# Patient Record
Sex: Female | Born: 1935 | Race: White | Hispanic: No | Smoking: Never smoker
Health system: Southern US, Community
[De-identification: ages and names within clinical notes are randomized; demographics above are authoritative.]

## PROBLEM LIST (undated history)

## (undated) DIAGNOSIS — I4891 Unspecified atrial fibrillation: Secondary | ICD-10-CM

## (undated) DIAGNOSIS — K5909 Other constipation: Secondary | ICD-10-CM

## (undated) DIAGNOSIS — Z9289 Personal history of other medical treatment: Secondary | ICD-10-CM

## (undated) DIAGNOSIS — E785 Hyperlipidemia, unspecified: Secondary | ICD-10-CM

## (undated) DIAGNOSIS — I10 Essential (primary) hypertension: Secondary | ICD-10-CM

## (undated) DIAGNOSIS — Z952 Presence of prosthetic heart valve: Secondary | ICD-10-CM

## (undated) DIAGNOSIS — E079 Disorder of thyroid, unspecified: Secondary | ICD-10-CM

## (undated) HISTORY — DX: Unspecified atrial fibrillation: I48.91

## (undated) HISTORY — DX: Other constipation: K59.09

## (undated) HISTORY — DX: Personal history of other medical treatment: Z92.89

## (undated) HISTORY — DX: Presence of prosthetic heart valve: Z95.2

## (undated) HISTORY — DX: Hyperlipidemia, unspecified: E78.5

## (undated) HISTORY — DX: Disorder of thyroid, unspecified: E07.9

## (undated) HISTORY — DX: Essential (primary) hypertension: I10

---

## 1997-04-25 ENCOUNTER — Ambulatory Visit (HOSPITAL_COMMUNITY): Admission: RE | Admit: 1997-04-25 | Discharge: 1997-04-25 | Payer: Self-pay | Admitting: Family Medicine

## 1999-06-25 ENCOUNTER — Emergency Department (HOSPITAL_COMMUNITY): Admission: EM | Admit: 1999-06-25 | Discharge: 1999-06-26 | Payer: Self-pay | Admitting: Emergency Medicine

## 1999-07-05 ENCOUNTER — Encounter: Payer: Self-pay | Admitting: Family Medicine

## 1999-07-05 ENCOUNTER — Encounter: Admission: RE | Admit: 1999-07-05 | Discharge: 1999-07-05 | Payer: Self-pay | Admitting: Family Medicine

## 1999-10-07 ENCOUNTER — Other Ambulatory Visit: Admission: RE | Admit: 1999-10-07 | Discharge: 1999-10-07 | Payer: Self-pay | Admitting: Family Medicine

## 2000-01-09 ENCOUNTER — Encounter (INDEPENDENT_AMBULATORY_CARE_PROVIDER_SITE_OTHER): Payer: Self-pay

## 2000-01-09 ENCOUNTER — Ambulatory Visit (HOSPITAL_COMMUNITY): Admission: RE | Admit: 2000-01-09 | Discharge: 2000-01-09 | Payer: Self-pay | Admitting: Urology

## 2000-07-14 ENCOUNTER — Encounter: Payer: Self-pay | Admitting: Family Medicine

## 2000-07-14 ENCOUNTER — Encounter: Admission: RE | Admit: 2000-07-14 | Discharge: 2000-07-14 | Payer: Self-pay | Admitting: Family Medicine

## 2000-11-04 ENCOUNTER — Other Ambulatory Visit: Admission: RE | Admit: 2000-11-04 | Discharge: 2000-11-04 | Payer: Self-pay | Admitting: Family Medicine

## 2001-02-17 ENCOUNTER — Ambulatory Visit (HOSPITAL_COMMUNITY): Admission: RE | Admit: 2001-02-17 | Discharge: 2001-02-17 | Payer: Self-pay | Admitting: Gastroenterology

## 2001-10-29 ENCOUNTER — Encounter: Admission: RE | Admit: 2001-10-29 | Discharge: 2001-10-29 | Payer: Self-pay | Admitting: Neurosurgery

## 2001-10-29 ENCOUNTER — Encounter: Payer: Self-pay | Admitting: Neurosurgery

## 2001-11-08 ENCOUNTER — Other Ambulatory Visit: Admission: RE | Admit: 2001-11-08 | Discharge: 2001-11-08 | Payer: Self-pay | Admitting: Family Medicine

## 2001-11-11 ENCOUNTER — Encounter: Admission: RE | Admit: 2001-11-11 | Discharge: 2001-11-11 | Payer: Self-pay | Admitting: Neurosurgery

## 2001-11-11 ENCOUNTER — Encounter: Payer: Self-pay | Admitting: Neurosurgery

## 2001-11-25 ENCOUNTER — Encounter: Payer: Self-pay | Admitting: Endocrinology

## 2001-11-25 ENCOUNTER — Encounter: Admission: RE | Admit: 2001-11-25 | Discharge: 2001-11-25 | Payer: Self-pay | Admitting: Endocrinology

## 2003-03-18 ENCOUNTER — Emergency Department (HOSPITAL_COMMUNITY): Admission: EM | Admit: 2003-03-18 | Discharge: 2003-03-18 | Payer: Self-pay | Admitting: Emergency Medicine

## 2003-04-13 ENCOUNTER — Ambulatory Visit (HOSPITAL_COMMUNITY): Admission: RE | Admit: 2003-04-13 | Discharge: 2003-04-13 | Payer: Self-pay | Admitting: *Deleted

## 2004-09-24 ENCOUNTER — Ambulatory Visit (HOSPITAL_COMMUNITY): Admission: RE | Admit: 2004-09-24 | Discharge: 2004-09-24 | Payer: Self-pay | Admitting: *Deleted

## 2004-11-08 ENCOUNTER — Encounter (INDEPENDENT_AMBULATORY_CARE_PROVIDER_SITE_OTHER): Payer: Self-pay | Admitting: *Deleted

## 2004-11-08 ENCOUNTER — Inpatient Hospital Stay (HOSPITAL_COMMUNITY): Admission: RE | Admit: 2004-11-08 | Discharge: 2004-11-14 | Payer: Self-pay | Admitting: Cardiothoracic Surgery

## 2004-12-12 ENCOUNTER — Encounter (HOSPITAL_COMMUNITY): Admission: RE | Admit: 2004-12-12 | Discharge: 2005-03-12 | Payer: Self-pay | Admitting: *Deleted

## 2005-03-04 ENCOUNTER — Ambulatory Visit (HOSPITAL_COMMUNITY): Admission: RE | Admit: 2005-03-04 | Discharge: 2005-03-04 | Payer: Self-pay | Admitting: *Deleted

## 2005-03-13 ENCOUNTER — Encounter (HOSPITAL_COMMUNITY): Admission: RE | Admit: 2005-03-13 | Discharge: 2005-06-11 | Payer: Self-pay | Admitting: *Deleted

## 2006-01-13 DIAGNOSIS — Z9289 Personal history of other medical treatment: Secondary | ICD-10-CM

## 2006-01-13 HISTORY — DX: Personal history of other medical treatment: Z92.89

## 2006-09-27 ENCOUNTER — Emergency Department (HOSPITAL_COMMUNITY): Admission: EM | Admit: 2006-09-27 | Discharge: 2006-09-27 | Payer: Self-pay | Admitting: Emergency Medicine

## 2006-10-07 ENCOUNTER — Inpatient Hospital Stay (HOSPITAL_COMMUNITY): Admission: EM | Admit: 2006-10-07 | Discharge: 2006-10-09 | Payer: Self-pay | Admitting: Emergency Medicine

## 2006-12-24 ENCOUNTER — Inpatient Hospital Stay (HOSPITAL_COMMUNITY): Admission: EM | Admit: 2006-12-24 | Discharge: 2006-12-26 | Payer: Self-pay | Admitting: Emergency Medicine

## 2007-04-14 ENCOUNTER — Encounter: Admission: RE | Admit: 2007-04-14 | Discharge: 2007-04-14 | Payer: Self-pay | Admitting: Family Medicine

## 2007-04-30 ENCOUNTER — Inpatient Hospital Stay (HOSPITAL_COMMUNITY): Admission: AD | Admit: 2007-04-30 | Discharge: 2007-05-06 | Payer: Self-pay | Admitting: Internal Medicine

## 2007-05-06 ENCOUNTER — Ambulatory Visit: Payer: Self-pay | Admitting: *Deleted

## 2008-03-12 ENCOUNTER — Emergency Department (HOSPITAL_COMMUNITY): Admission: EM | Admit: 2008-03-12 | Discharge: 2008-03-12 | Payer: Self-pay | Admitting: Emergency Medicine

## 2010-04-30 LAB — DIFFERENTIAL
Basophils Absolute: 0.1 10*3/uL (ref 0.0–0.1)
Basophils Relative: 1 % (ref 0–1)
Eosinophils Absolute: 0.1 10*3/uL (ref 0.0–0.7)
Eosinophils Relative: 1 % (ref 0–5)
Lymphocytes Relative: 19 % (ref 12–46)
Lymphs Abs: 1.3 10*3/uL (ref 0.7–4.0)
Monocytes Absolute: 0.6 10*3/uL (ref 0.1–1.0)
Monocytes Relative: 9 % (ref 3–12)
Neutro Abs: 4.7 10*3/uL (ref 1.7–7.7)
Neutrophils Relative %: 70 % (ref 43–77)

## 2010-04-30 LAB — COMPREHENSIVE METABOLIC PANEL
ALT: 56 U/L — ABNORMAL HIGH (ref 0–35)
AST: 27 U/L (ref 0–37)
Albumin: 3.3 g/dL — ABNORMAL LOW (ref 3.5–5.2)
Alkaline Phosphatase: 134 U/L — ABNORMAL HIGH (ref 39–117)
BUN: 17 mg/dL (ref 6–23)
CO2: 30 mEq/L (ref 19–32)
Calcium: 9.2 mg/dL (ref 8.4–10.5)
Chloride: 102 mEq/L (ref 96–112)
Creatinine, Ser: 0.74 mg/dL (ref 0.4–1.2)
GFR calc Af Amer: >60 mL/min (ref >60)
GFR calc non Af Amer: >60 mL/min (ref >60)
Glucose, Bld: 111 mg/dL — ABNORMAL HIGH (ref 70–99)
Potassium: 3.4 mEq/L — ABNORMAL LOW (ref 3.5–5.1)
Sodium: 141 mEq/L (ref 135–145)
Total Bilirubin: 1.4 mg/dL — ABNORMAL HIGH (ref 0.3–1.2)
Total Protein: 5.9 g/dL — ABNORMAL LOW (ref 6.0–8.3)

## 2010-04-30 LAB — CBC
HCT: 37.2 % (ref 36.0–46.0)
Hemoglobin: 13.1 g/dL (ref 12.0–15.0)
MCHC: 35.3 g/dL (ref 30.0–36.0)
MCV: 100.7 fL — ABNORMAL HIGH (ref 78.0–100.0)
Platelets: 216 10*3/uL (ref 150–400)
RBC: 3.69 MIL/uL — ABNORMAL LOW (ref 3.87–5.11)
RDW: 14.1 % (ref 11.5–15.5)
WBC: 6.7 10*3/uL (ref 4.0–10.5)

## 2010-04-30 LAB — URINALYSIS, ROUTINE W REFLEX MICROSCOPIC
Glucose, UA: NEGATIVE mg/dL
Hgb urine dipstick: NEGATIVE
Ketones, ur: 40 mg/dL — AB
Leukocytes, UA: NEGATIVE
Nitrite: NEGATIVE
Protein, ur: 30 mg/dL — AB
Specific Gravity, Urine: 1.025 (ref 1.005–1.030)
Urobilinogen, UA: 2 mg/dL — ABNORMAL HIGH (ref 0.0–1.0)
pH: 6 (ref 5.0–8.0)

## 2010-04-30 LAB — URINE CULTURE
Colony Count: NO GROWTH
Culture: NO GROWTH

## 2010-04-30 LAB — URINE MICROSCOPIC-ADD ON

## 2010-05-28 NOTE — Discharge Summary (Signed)
[name], [name]               ACCOUNT NO.:  [id number]   MEDICAL RECORD NO.:  [id number]          PATIENT TYPE:  INP   LOCATION:  6707                         FACILITY:  [facility]   PHYSICIAN:  Ramiro Harvest, MD    DATE OF BIRTH:  1929-03-12   DATE OF ADMISSION:  12/24/2006  DATE OF DISCHARGE:  12/26/2006                               DISCHARGE SUMMARY   PRIMARY CARE PHYSICIAN:  Robert L. Foy Guadalajara, M.D., [facility].   DIAGNOSES:  1. Altered mental status secondary to hyponatremia, resolved.  2. Hyponatremia.  3. Hypertension.  4. Hypothyroidism.  5. Hyperlipidemia.  6. Degenerative joint disease.  7. History of spinal stenosis.   DISCHARGE MEDICATIONS:  1. Toprol XL 100 mg p.o. daily.  2. Aspirin 325 mg p.o. daily.  3. Vytorin 10/80 p.o. at bedtime.  4. Synthroid 88 mcg p.o. daily.  5. Zantac 150 mg p.r.n.  6. Fiber pill 1-2 pills daily,.  7. Caltrate 600 Plus D daily.  8. Stool softener daily.   DISPOSITION/FOLLOW UP:  The patient will be discharged home today.  The  patient is scheduled to follow up appointment with PCP in one week.  The  patient will also need to get a basic metabolic profile checked at her  PCP's office on Wednesday, December 30, 2006, to monitor the patient's  sodium level and potassium level.  The patient's lisinopril was held on  day of discharge and will defer the restart of the patient's lisinopril  to PCP based on the patient's latest metabolic profile and her potassium  levels.   PROCEDURES PERFORMED:  None.   CONSULTATIONS:  None.   BRIEF ADMISSION HISTORY AND PHYSICAL:  [name] is a 77-year-  old white female with past medical history of hypothyroidism and  hypertension who was last in the hospital and discharged on Benicar  without HCTZ which had been her previous medication.  The reason for  this was that the patient had been having bouts of hyponatremia so was  switched over to Benicar alone which she had been taking  for awhile, but  then the patient had run out of Benicar and had previous medication left  over from her prime medicine of Benicar/HCTZ and the patient started to  take these medications.  It was felt to have dropped the patient's  sodium to the point that her family had noticed over the day prior to  admission that the patient had been confused.  They were quite  concerned.  The had the patient to be seen by her PCP who evaluated her  and had her sent to the emergency room with a possibility of  hyponatremia.  Labs were ordered on the patient on arrival in the ED.  She was found to have a sodium of 114.  The rest of her labs including  CBC were unremarkable as well as a blood pressure.  The patient was  started on IV fluids and after a liter of IV fluids the patient appeared  to be a little more alert.  The family stated that she had improved  significantly from when  she first came in.  The patient was also able to  talk with Dr. Rito Ehrlich at that time.  The patient had stated to Dr.  Rito Ehrlich that she was feeling a little bit better but still weak.  She  complained of a mild headache as well as some neck pain which she states  is secondary to  arthritis in her neck.  She also complains of an upset  stomach.  The patient denied any visual changes.  No distress and no  chest pain.  No palpitations, no shortness of breath, wheeze, or cough.  No hematuria or dysuria.  No constipation or diarrhea.  No focal  extremity numbness, weakness, or pain.   PHYSICAL EXAMINATION:  VITAL SIGNS:  Temperature 98.2, pulse 32, blood  pressure 148/83, which decreased to 121/68, respirations 20, saturating  94% on room air.  GENERAL:  The patient was alert and oriented x3.  No apparent distress.  HEENT:  Normocephalic, atraumatic.  Mucous membranes slightly dry.  NECK:  No carotid bruits.  CARDIOVASCULAR:  Regular rate and rhythm.  S1/S2.  LUNGS:  Clear to auscultation bilaterally.  ABDOMEN:  Soft,  nontender, nondistended.  Positive bowel sounds.  EXTREMITIES:  No clubbing, cyanosis, or edema.   LABORATORY DATA:  Sodium 114, potassium 3.5, chloride 76, bicarb 28.  No  anion gap.  BUN 8, creatinine 0.7, glucose 115.  CBC:  White count 7.9,  hemoglobin 13.1, hematocrit 38, platelet count 282.  Urinalysis noted at  trace hemoglobin, 30 protein, small leukocyte esterase, 7-10 white blood  cells, many bacteria, but nitrites negative.   HOSPITAL COURSE:  1. ALTERED MENTAL STATUS FELT TO BE SECONDARY TO HYPONATREMIA.  The      patient was given normal saline IV bolus in the ED and placed on      100 ml per hour.  The patient's blood pressure medications were      held.  The patient's symptoms improved daily.  On the day of      discharge the patient's altered mental status had resolved.  The      patient was at her baseline per family and the patient was very      insistent on going home.  On the day of discharge the patient was      in improved and stable condition.  2. HYPONATREMIA.  On admission the patient was noted to have a sodium      of 114.  It was felt this was secondary to hypovolemic      hyponatremia, secondary to the HCTZ component of the patient's      medication.  The patient was fluid resuscitated with normal saline      IV.  The patient continued to improved.  She had good p.o. intake.      The patient's symptoms resolved and on the day of discharge the      patient's sodium was now at 130.  The patient was very insistent on      going home and as the patient was asymptomatic and her sodium was      going in the right direction, it was felt the patient could be sent      home, but the patient will need a basic metabolic profile checked      on Wednesday, December 30, 2006.  PCP to follow the patient's      sodium and potassium levels.  3. HYPERTENSION.  During her hospitalization, the patient's blood  pressure was stable.  The patient was not restarted on any of  her      antihypertensive medications.  The patient will be sent home to be      restarted on her Toprol XL.  Will hold the patient's lisinopril for      now and defer that for restart per PCP.  The patient will also need      BMET done on December 30, 2006 to follow her potassium as well as      her sodium.  If the patient's potassium is normal, may restart the      patient's lisinopril.  4. HYPOTHYROIDISM.  TSH was obtained which was within normal limits.      The patient was maintained on her home dose of Synthroid.  5. HYPERLIPIDEMIA:  The patient was maintained during her      hospitalization on her home dose of Vytorin.  The rest of the      patient's chronic and medical issues were stable throughout the      hospitalization.  On the day of discharge the vital signs showed      temperature 98.7, pulse 93, blood pressure 163/83, respiratory rate      22, saturating 96% on room air.   LABORATORY DATA:  Discharge labs:  Sodium 130, potassium 5, chloride 97,  bicarb 25, BUN 8, creatinine 0.64, and a glucose of 107, calcium of 8.4  The patient will be discharged home in stable and improved condition  until follow-up with her PCP.  It has been a pleasure taking care of Ms.  [name].      Ramiro Harvest, MD  Electronically Signed     DT/MEDQ  D:  12/26/2006  T:  12/28/2006  Job:  [id number]   cc:   Molly Maduro L. Foy Guadalajara, M.D.

## 2010-05-28 NOTE — H&P (Signed)
NAME:  [name], [name]               ACCOUNT NO.:  [id number]   MEDICAL RECORD NO.:  [id number]          PATIENT TYPE:  INP   LOCATION:  5030                         FACILITY:  [facility]   PHYSICIAN:  Kela Millin, M.D.DATE OF BIRTH:  Jan 24, 1929   DATE OF ADMISSION:  04/30/2007  DATE OF DISCHARGE:                              HISTORY & PHYSICAL   CHIEF COMPLAINT:  Progressive decreased p.o. intake and weight loss.   HISTORY OF PRESENT ILLNESS:  The patient is a 77 year old white female  with a past medical history significant for hypertension,  hypothyroidism, hyperlipidemia, DJD, history of aortic valve replacement  in 2006 with a tissue valve complicated by postoperative atrial  fibrillation, history of spinal stenosis, last discharged from the  hospital in December 2008 following hospitalization with altered mental  status thought to be secondary to hyponatremia.  She presents with the  above complaints.  Per her primary care physician, the patient was seen  at the office today and because of her ongoing and worsening problems  with decreased p.o. intake to where she has not been taking her  medications lately and also has been losing weight, she was directly  admitted to the hospital for further evaluation and management.  The  patient's daughter is at her bedside assisting with the history.  They  report that since her last discharge from the hospital, she has been  having intermittent problems with decreased p.o. intake and constipation  but that lately this had worsened to the point where the patient has not  even been taking her medications.  They report that she lost her husband  in March and was diagnosed with depression and was tried on different  antidepressants by her primary care physician, but her symptoms seemed  to worsen and so after taking them for a couple of months she stopped.  The patient states that when she swallows her food it feels like it gets  stuck in her  lower chest area, and because of that she does not feel  like eating.  She denies nausea, vomiting, odynophagia, and no  hematemesis.  She states that she feels full but no abdominal pain.  She  denies chest pain, shortness of breath, cough, hemoptysis, melena,  hematochezia.  No diarrhea.  She states that she has had problems with  constipation and she cannot recall when her last bowel movement was.  She admits to dysuria for some time.  Denies fevers and no chills.  The  patient had an MRI of her brain done outpatient, and it was negative per  PCP.   PAST MEDICAL HISTORY:  As above.   MEDICATIONS:  1. Lisinopril 40 mg daily.  2. Toprol XL 100 mg daily.  3. Synthroid 88 mcg daily.   There are other medications listed in her December discharge.  The  medications are:  1. Aspirin 325 mg daily.  2. Zantac 150 mg p.r.n.  3. Stool softener.  4. Vytorin 10/80 mg q.h.s.   As noted above, the patient has not been taking any of the above  medications for some time now.  ALLERGIES:  CODEINE AND CELEBREX.   SOCIAL HISTORY:  She denies tobacco.  She also denies alcohol.   FAMILY HISTORY:  Her brother had leukemia and her mother had an MI in  her 59s.  Her father had heart disease.   REVIEW OF SYSTEMS:  As per HPI, other review of systems negative.   PHYSICAL EXAMINATION:  GENERAL:  The patient is an elderly white female.  Flat affect.  Delayed but appropriate responses to questions.  In no  apparent distress.  VITAL SIGNS:  Her temperature is 97 with a pulse of 113, respiratory  rate of 14, blood pressure 170/85, O2 sat 93% on room air.  HEENT:  PERRL, EOMI.  Dry mucous membranes.  No oral exudates.  NECK:  Supple.  No adenopathy, no thyromegaly and no JVD.  LUNGS:  Decreased breath sounds at the bases.  No crackles and no  wheezes.  CARDIOVASCULAR:  Regular.  Normal normal S1 and S2.  ABDOMEN:  Soft.  Bowel sounds present.  Nontender, nondistended.  No  organomegaly and no  masses palpable.  EXTREMITIES:  No cyanosis and no edema.  NEURO:  She is alert and oriented.  Cranial nerves II-XII grossly intact  and a nonfocal exam.   LABORATORY DATA:  All lab work ordered and pending at this time.   ASSESSMENT AND PLAN:  1. Probable dysphagia - as discussed above, she states that food      sits in her lower chest area after she eats, and she has had      worsening p.o. intake and some weight loss.  We will obtain a      complete barium swallow, follow and consider a GI consultation      pending above.  2. Uncontrolled hypertension - as mentioned above, the patient has      been noncompliant with medications lately.  We will resume      outpatient medications, monitor and treat as appropriate.  3. Depression versus grief reaction/failure to thrive - as discussed      above, she has been tried on antidepressants outpatient and she      stopped after a couple of months because her symptoms appeared to      be worsening.  Follow and consider psychiatric consultation while      in the hospital.  4. Constipation - chronic.  Continue stool softeners, Dulcolax p.r.n.      Obtain a CBC and if anemic stool guaiacs, follow and consider      further GI evaluation as appropriate.  We will also check a TSH      level, especially given that she has been noncompliant with her      medications and follow.  5. Hyperlipidemia - follow and resume outpatient medications as      appropriate.  6. History of hyponatremia - obtain electrolytes, follow and manage as      appropriate.  7. Tachycardia - likely secondary to volume depletion, obtain an EKG,      hydrate, follow and recheck.  8. Dysuria - obtain a UA with cultures and follow.  9. History of aortic valve replacement with a tissue valve in 2006.      Kela Millin, M.D.  Electronically Signed     ACV/MEDQ  D:  04/30/2007  T:  04/30/2007  Job:  [id number]   cc:   Molly Maduro L. Foy Guadalajara, M.D.

## 2010-05-28 NOTE — H&P (Signed)
NAMEBEULA, [name]               ACCOUNT NO.:  [id number]   MEDICAL RECORD NO.:  [id number]          PATIENT TYPE:  INP   LOCATION:  1829                         FACILITY:  [facility]   PHYSICIAN:  Hollice Espy, M.D.DATE OF BIRTH:  Mar 21, 1929   DATE OF ADMISSION:  12/24/2006  DATE OF DISCHARGE:                              HISTORY & PHYSICAL   PRIMARY CARE PHYSICIAN:  Robert L. Foy Guadalajara, M.D.   CHIEF COMPLAINT:  Confusion.   HISTORY OF PRESENT ILLNESS:  The patient is a 77 year old white female  with past medical history of hypothyroidism and hypertension who was  last in the hospital and discharged on Benicar without  hydrochlorothiazide which had been her previous medication.  The reason  for this is that she had been having drops in her sodium.  She was  switched over to Benicar alone which she was taking for a while, but  then when she ran out she had her previous medication left over and  started taking the Benicar again with the hydrochlorothiazide component.  This is felt to have dropped her sodium to the point that her family has  noticed over the last day that she has been confused.  They were quite  concerned.  They had the patient see her PCP who evaluated her and had  her sent over to the emergency room concerned about the possibility of  low sodium.  Labs were ordered on the patient when she arrived to the  emergency room.  She was found to have a sodium of 114.  The rest of her  labs including CBC were unremarkable as well as her blood pressure.  The  patient was started on IV fluids and after 1 liter of fluids she already  appears to be  a bit more alert and the family says that she is already  much improved than when she first came in.  The patient herself is  currently able to talk to me.  She tells me that she is feeling a little  bit better, still very weak.  She complained of a mild headache as well  as some neck pain which she says is from her arthritis in her  neck.  She  also complains of some upset stomach.  She denies any vision changes, no  dysphagia.  No chest pain or palpitations.  No shortness of breath,  wheeze, cough.  No hematuria or dysuria.  No constipation or diarrhea.  No focal extremity numbness, weakness, or pain.   REVIEW OF SYSTEMS:  Otherwise negative.   PAST MEDICAL HISTORY:  Hypertension and hypothyroidism, hyperlipidemia,  and DJD.   MEDICATIONS:  1. She is supposed to be on Benicar 40 mg p.o. daily, but what she has      been taking instead is Benicar/hydrochlorothiazide 40/25 mg p.o.      daily.  2. Toprol XL 100 mg.  3. Aspirin 325 mg.  4. Pepcid 20 mg p.o. q.h.s.  5. Vytorin 10/10 p.o. daily.  6. Percocet p.r.n.  7. Synthroid.   ALLERGIES:  CELEBREX, CODEINE.   SOCIAL HISTORY:  No tobacco, alcohol,  or drug use.   FAMILY HISTORY:  Noncontributory.   PHYSICAL EXAMINATION:  VITAL SIGNS:  Temperature 98.2, heart rate 82,  blood pressure 148/83 now down to 121/68, respirations 22, O2 saturation  94% on room air.  GENERAL:  She is alert and oriented x3 in no apparent distress.  HEENT:  Normocephalic and atraumatic.  Her mucous membranes are slightly  dry.  She has no carotid bruits.  HEART:  Regular rate and rhythm, S1 and S2.  LUNGS:  Clear to auscultation bilaterally.  ABDOMEN:  Soft, nontender, and nondistended.  Positive bowel sounds.  EXTREMITIES:  No cyanosis, clubbing, or edema.   LABORATORY DATA:  Sodium 114, potassium 3.5, chloride 76, bicarb 28.  She has no anion gap.  BUN 8, creatinine 0.7, glucose 115.  White count  7.9, H&H 13.1 and 38, MCV 100, platelet count 282, 50% neutrophils.  UA  notes a trace hemoglobin, 30 protein, small leukocyte esterase, 7-10  white cells, and many bacteria, but nitrite negative.   ASSESSMENT:  1. Altered mental status.  This is secondary to hyponatremia.  2. Hyponatremia secondary to resumption of thiazide diuretic.  Stop      her thiazide diuretic and hydrate.  Recheck labs in the morning.  3. Hypertension.  We are holding her Benicar until we have properly      hydrated her.  4. Hypothyroidism.      Hollice Espy, M.D.  Electronically Signed     SKK/MEDQ  D:  12/24/2006  T:  12/24/2006  Job:  [id number]   cc:   Molly Maduro L. Foy Gu[place]ara, M.D.

## 2010-05-28 NOTE — Discharge Summary (Signed)
NAME:  [name], [name]               ACCOUNT NO.:  [id number]   MEDICAL RECORD NO.:  [id number]          PATIENT TYPE:  INP   LOCATION:  5030                         FACILITY:  [facility]   PHYSICIAN:  Corinna L. Lendell Caprice, MDDATE OF BIRTH:  [birth year]   DATE OF ADMISSION:  04/30/2007  DATE OF DISCHARGE:  05/05/2007                               DISCHARGE SUMMARY   DISCHARGE DIAGNOSES:  1. Failure to thrive/weight loss secondary to severe depression with      an episode of psychosis while here in the hospital.  2. Uncontrolled hypertension secondary to noncompliance.  3. Hypothyroidism.  4. Resolved hypokalemia.   MEDICATIONS AT THE TIME OF TRANSFER:  1. Aspirin 325 mg a day.  2. Toprol XL 100 mg a day.  3. Lisinopril 40 mg a day.  4. Levothyroxine 88 mcg a day.  5. Haldol 2 mg IV or p.o. as needed.  6. She had been started on clonidine but I do not think that she will      ultimately end up needing this.  7. She has been getting Ensure t.i.d. and we have been trying to      encourage her oral intake.   DIET:  Regular with Ensure.  Please encourage oral intake.   She will need a repeat basic metabolic panel with attention to potassium  in a few days.  Psychiatric medications per psychiatrist.   CONDITION:  Stable.   CONSULTATIONS:  Jasmine Pang, M.D.   PROCEDURES:  None.   PERTINENT LABORATORIES:  CBC unremarkable.  Complete metabolic panel on  admission significant for an albumin of 3.4.  Her potassium on admission  was 3.6 but dropped to 2.7 and was subsequently repleted.  Point of care  enzymes essentially negative.  Ammonia level normal.  TSH 2.98.  B12,  RBC folate normal.  Urinalysis showed a specific gravity of 1.033, 40  ketones, moderate bilirubin, trace blood, 30 protein, negative nitrite,  small leukocyte esterase, 11-20 white cells, few squamous epithelial  cells, few bacteria, but urine culture was indicative of a contaminant.  RPR nonreactive.   SPECIAL  STUDIES/RADIOLOGY:  Two views of the chest showed nothing  active.  An MRI of the brain had been done several weeks prior to  admission.  Barium swallow showed no stricture or discrete mass, but the  exam was limited due to difficulty with patient compliance.  Esophageal  motility was within normal limits.  Sliding hiatal hernia.  Incidental  duodenal diverticulum.  CT of the abdomen and pelvis showed  cholecystectomy clips, minimal dilatation of the intrahepatic and  extrahepatic bile duct, possibly within normal limits in a patient who  is status post cholecystectomy.  Recommend clinical correlation.  EKG  showed normal sinus rhythm with left ventricular hypertrophy and  repolarization abnormality.   HISTORY AND HOSPITAL COURSE:  Ms. Mcquilkin is a 77 year old white female  who was brought to the emergency room with failure to thrive and weight  loss.  She had been on antihypertensives and thyroid replacement but  stopped this on her own.  She had mentioned some sketchy dysphagia and  the workup was negative.  She recently lost her husband to cancer and  the daughter reported profound dehydration.  She was admitted from Dr.  Pablo Lawrence office and had lost about 30 pounds in the past several months.  She had dry mucous membranes.  Her heart rate was 113, blood pressure  170/85.  She had a soft abdomen.  She was oriented.  She had very flat  affect and a workup was essentially negative for medical reason for  weight loss.  It became clear that the patient had vegetative symptoms  of depression.  She had a paucity of speech.  She admitted that her  sleep patterns were erratic.  She no longer enjoyed things that she used  to enjoy.  She did not feel hungry, she never ate, and her daughter  confirmed this.  Psychiatry was consulted and initially the patient's  exam appeared normal; however, she appeared somewhat expansive at that  time and there was concern that she may be cycling and possibly  have  early mania.  Right after she was seen by Dr. Katrinka Blazing, the patient became  psychotic and was hallucinating.  She thought that the nurses were  trying to give her alcohol and she thought a devil was on her shoulder.  She was refusing all of her medications.  She was given Haldol, which  helped with her agitation, but she very quickly became quite depressed  again.  Nutrition was consulted and made recommendations.  Her intake  remains subnormal but has improved since she has been here.   Her blood pressure remained a problem initially and her medications were  adjusted.  At the time of discharge she is normotensive and she is back  on all of her medications.  Dr. Katrinka Blazing feels that the patient will need  inpatient psychiatric treatment and she is awaiting placement at an  inpatient facility at this time.  She is medically stable.  Dr. Katrinka Blazing  reports that the patient is committable.   Activity should be ad lib.   CONDITION:  Stable.   She will need follow-up with her primary care physician in 2-4 weeks  after discharge from psych facility.      Corinna L. Lendell Caprice, MD  Electronically Signed     CLS/MEDQ  D:  05/05/2007  T:  05/05/2007  Job:  [id number]   cc:   Molly Maduro L. Foy Guadalajara, M.D.

## 2010-05-28 NOTE — Discharge Summary (Signed)
[name], [name]               ACCOUNT NO.:  [id number]   MEDICAL RECORD NO.:  [id number]          PATIENT TYPE:  INP   LOCATION:  6733                         FACILITY:  [facility]   PHYSICIAN:  Barnetta Chapel, MDDATE OF BIRTH:  02/14/1929   DATE OF ADMISSION:  10/06/2006  DATE OF DISCHARGE:  10/09/2006                               DISCHARGE SUMMARY   PRIMARY CARE Jovan Colligan:  Molly Maduro L. Foy Guadalajara, M.D.   DISCHARGE DIAGNOSES:  1. Altered mental status, resolved.  2. Hyponatremia, resolved.  3. Hypokalemia, resolved.  4. Nausea and vomiting, resolved.  5. Hypertension, controlled.  6. Back pain.  7. Spinal stenosis.   DISCHARGE MEDICATIONS:  1. Levoxyl 100 mcg once daily.  2. Toprol XL 100 mg once daily.  3. Aspirin 325 mg p.o. once daily.  4. Fiber Choice 1 p.o. once.  5. Vytorin 10/80 one tab p.o. once daily.  6. Benicar 40 mg once daily.  7. Colace 100 mg p.o. b.i.d.  8. Tramadol 50 mg p.o. four times daily as needed for pain.   CONSULTATIONS:  None.   IMAGING STUDIES:  MRI of the lumbosacral area which revealed severe  central spinal stenosis of L3-4, mild L4-5 and L5-S1; neural foraminal  stenosis involving left L3-L5 and L5-S1 and right L4-5.   BRIEF HISTORY AND HOSPITAL COURSE:  Please refer to the history and  physical done on October 07, 2006.  The patient is a 77 year old  female with past medical history significant for hypertension, coronary  artery disease, hypothyroidism, gastroesophageal reflux disease and  lower extremity and back pain.  The patient presented with nausea,  vomiting, weakness and altered mental status which was felt to be  secondary to pain medications.  On admission, the patient's sodium was  110 and the patient was also hypokalemic and dehydrated.   The patient was admitted to the regular medical floor.  The patient was  adequately rehydrated.  The hyponatremia improved to 132 on the day of  discharge.  The patient's hypokalemia was  also corrected.  Nausea and  vomiting were managed with antiemetics.  The patient's back pain was  worked up with an MRI of the lumbosacral region (please see above).  The  patient's back pain is well controlled.  She will be discharged on  Tramadol 50 mg q.i.d. for the back pain.   The patient has done well and will be discharged back home today to the  care of the primary care Toretto Tingler.   DISCHARGE PLAN:  1. Discharge the patient home today.  2. Follow-up with the primary care Alayja Armas, Dr. Marinda Elk in one      week.  3. Home PT/OT.  4. Cardiac diet.  5. Activities as per PT/OT.      Barnetta Chapel, MD  Electronically Signed     SIO/MEDQ  D:  10/09/2006  T:  10/09/2006  Job:  [id number]   cc:   Molly Maduro L. Foy Guadalajara, M.D.

## 2010-05-28 NOTE — H&P (Signed)
NAMEARIANIS, BOWDITCH               ACCOUNT NO.:  [id number]   MEDICAL RECORD NO.:  [id number]          PATIENT TYPE:  INP   LOCATION:  6733                         FACILITY:  [facility]   PHYSICIAN:  Hollice Espy, M.D.DATE OF BIRTH:  April 23, 1929   DATE OF ADMISSION:  10/07/2006  DATE OF DISCHARGE:                              HISTORY & PHYSICAL   PRIMARY CARE PHYSICIAN:  Robert A. Nicholos Johns, M.D.   CHIEF COMPLAINT:  Nausea, vomiting, weakness.   HISTORY OF PRESENT ILLNESS:  The patient is a 77 year old white female  with past medical history of hypertension, CAD, and hypothyroidism who  for the last several weeks has been feeling very weak. She has had  problems with lower back pain and had been put on oral pain medications.  When her back pain persisted, she came to the emergency room and her  pain medication was increased in dosage.  This lead to her having  episodes of nausea and vomiting.  She also tells me that she has not had  a solid bowel movement in the last several weeks, ever since she has  been put on narcotic medication.  With the nausea and vomiting  persisting, her appetite got worse and she has been feeling more and  more weak.  She was brought into the emergency room by her daughters  today.  Labs were done on the patient on admission and she was found to  have a normal white count with no signs of infection.  However, what was  concerning was a sodium of 112.  The patient was given medications for  nausea and was feeling a little bit better although she feels still  quite fatigued.   She denies any headaches, vision changes, dysphagia, chest pain,  palpitations, shortness of breath, wheezing, coughing.  She denies any  abdominal pain.  No hematuria or dysuria and diarrhea.  But she does  have constipation.  No focal extremity numbness, weakness or pain.  She  feels overall quite weak generally.  Review of systems is otherwise  negative. She also has some mild  nausea.   PAST MEDICAL HISTORY:  1. Hypertension.  2. CAD.  3. Hypothyroidism.  4. GERD.  5. Lower extremity and back pain which is subacute.   MEDICATIONS:  1. Benicar/HCTZ 40/25 p.o. daily.  2. Toprol-XL 100 mg daily.  3. Aspirin 325 mg daily.  4. Pepcid 20 mg at bedtime.  5. Vytorin 10/10 p.o. at bedtime.  6. Percocet p.r.n.  7. Synthroid daily dose unclear although based on previous records,      112 mcg.   ALLERGIES:  CODEINE, CELEBREX.   SOCIAL HISTORY:  She denies any tobacco, alcohol or drug use.  She lives  at home.   FAMILY HISTORY:  Noncontributory.   PHYSICAL EXAMINATION:  VITAL SIGNS:  On admission, temperature 97, heart  rate 74, blood pressure 158/71, respirations 28, oxygen saturation 95%  on room air.  GENERAL:  She is alert and oriented x3, in no apparent distress  HEENT:  Normocephalic, atraumatic.  Mucous membranes are slightly dry.  She has no carotid bruits.  HEART:  Regular rate and rhythm.  S1 and S2.  LUNGS:  Clear to auscultation bilaterally.  ABDOMEN:  Soft, slightly distended.  Hypoactive bowel sounds.  EXTREMITIES:  No clubbing, cyanosis or edema.Marland Kitchen   LABORATORY DATA:  Sodium 112, potassium 3.3, chloride 88, bicarb 29, BUN  8, creatinine 0.6, glucose 135.  White count 7.5, no shift.  Hemoglobin  12.8, hematocrit 36, MCV 98, platelet count 283,000.   ASSESSMENT/PLAN:  1. Hyponatremia.  This is likely a combination of dehydration and      vomiting plus the patient being on a thiazide diuretic.  Will hold      this medication.  Put her on 2000 cc fluid restriction and gently      hydrate and continue to follow.  2. Nausea and vomiting.  Likely in part she may have some sensitivity      to increased narcotic medication. Another possibility she is likely      constipated, also from narcotics.  Will p.r.n. Zofran for the      patient as well as plans to put her on a bowel regimen of Colace      and a laxative.  3. Lower  back pain.  No signs of  any severe pain.  This appears to be      more degenerative joint disease and some sciatica from prolonged      bed rest.  Will get PT/OT evaluation.  4. Hypertension.  Continue Benicar.  5. Hypothyroidism.  Continue Synthroid.  6. Hypokalemia.  Will replace.      Hollice Espy, M.D.  Electronically Signed     SKK/MEDQ  D:  10/07/2006  T:  10/07/2006  Job:  [id number]   cc:   Molly Maduro A. Nicholos Johns, M.D.

## 2010-05-31 NOTE — Procedures (Signed)
[place]  Patient:    [name], [name] Visit Number: [id number] MRN: [id number]          Service Type: END Location: ENDO Attending Physician:  Nelda Marseille Dictated by:   Petra Kuba, M.D. Proc. Date: 02/17/01 Admit Date:  02/17/2001   CC:         [name], M.D.                           Procedure Report  PROCEDURE:  Esophagogastroduodenoscopy.  INDICATION:  Guaiac positivity, nondiagnostic colonoscopy.  Consent was signed after risks, benefits, methods, and options were thoroughly discussed with both Mr. and Mrs. [name] in the office.  MEDICATIONS:  Additional medications for this procedure 1 of Versed only.  PROCEDURE:  The video endoscope was inserted by direct vision.  She had some reflux in her esophagus, which was easily suctioned.  She did have a moderate sized hiatal hernia.  The scope was inserted into the stomach, advanced to the antrum, and through a normal-appearing pylorus into a normal duodenal bulb, and around the C-loop to a normal second portion of the duodenum.  The scope was slowly withdrawn back to the bulb and a good look there ruled out ulcer in that location.  The stomach was evaluated on retroflexed and then straight visualization.  There was some mild gastritis antritis and some scope trauma on the pylorus with passing the scope.  However, due to her moderate sized hiatal hernia had some difficulty holding the air and complete maximal insufflation was not obtained, but no abnormality was seen.   High in the cardia the hiatal hernia was confirmed.  The scope was then slowly withdrawn. Again, a good look at the esophagus was normal.  The scope was removed.  The patient tolerated the procedure well.  There was no obvious immediate complication.  ENDOSCOPIC DIAGNOSES: 1. Moderate hiatal hernia with some reflux. 2. Gastritis antritis. 3. Unable to hold air and completely insufflate stomach. 4.  Otherwise normal esophagogastroduodenoscopy.  PLAN:  Follow-up p.r.n. and in two months recheck guaiac symptoms and determine any other work-up and plans. Dictated by:   Petra Kuba, M.D. Attending Physician:  Nelda Marseille DD:  02/17/01 TD:  02/18/01 Job: [id number]

## 2010-05-31 NOTE — Discharge Summary (Signed)
[name], [name]               ACCOUNT NO.:  [id number]   MEDICAL RECORD NO.:  [id number]          PATIENT TYPE:  INP   LOCATION:  2005                         FACILITY:  [facility]   PHYSICIAN:  Sheliah Plane, MD    DATE OF BIRTH:  September 28, 1929   DATE OF ADMISSION:  11/08/2004  DATE OF DISCHARGE:                                 DISCHARGE SUMMARY   PRIMARY DIAGNOSES:  Aortic valve stenosis.   IN-HOSPITAL DIAGNOSES:  1.  Postoperative atrial fibrillation.  2.  Acute blood loss anemia postoperatively.  3.  Volume overload.  4.  Hypokalemia.   SECONDARY DIAGNOSES:  1.  Hypothyroidism.  2.  History of arthritis and status post __________ chelation therapy.  3.  Status post cholecystectomy.  4.  Status post appendectomy.   ALLERGIES:  Allergic to CELEBREX and CODEINE.   IN-HOSPITAL OPERATIONS/PROCEDURES:  Dictation ended at this point.      Theda Belfast, [place]      Sheliah Plane, MD  Electronically Signed    KMD/MEDQ  D:  11/12/2004  T:  11/12/2004  Job:  [id number]

## 2010-05-31 NOTE — Procedures (Signed)
[place]  Patient:    YARETSI, HUMPHRES Visit Number: [id number] MRN: [id number]          Service Type: END Location: ENDO Attending Physician:  Nelda Marseille Dictated by:   Petra Kuba, M.D. Proc. Date: 02/17/01 Admit Date:  02/17/2001   CC:         Marinda Elk, M.D.                           Procedure Report  PROCEDURE:  Colonoscopy.  INDICATION:  Guaiac positivity.  Consent was signed after risks, benefits, methods, and options thoroughly discussed in the office.  MEDICATIONS:  Demerol 75 mg, Versed 7.5 mg.  DESCRIPTION OF PROCEDURE:  Rectal inspection was pertinent for external hemorrhoids, small.  Digital exam was negative.  The video pediatric adjustable colonoscope was inserted, fairly easily advanced around the colon to the cecum.  It did require rolling her on her back and various abdominal pressures.  On insertion, left-sided diverticula were seen and no other abnormalities.  The cecum was then identified by the appendiceal orifice and the ileocecal valve.  In fact, the scope was inserted a short way into the terminal ileum, which was normal.  Photo documentation was obtained.  The scope was slowly withdrawn.  Prep was adequate.  There was some liquid stool that required washing and suctioning, but on slow withdrawal through the colon, other than the left-sided diverticula, no other abnormalities were seen.  Specifically, no polypoid lesions, bleeding lesions, or other abnormalities.  Once back in the rectum the scope was retroflexed, pertinent for some internal hemorrhoids.  The scope was straightened, and rectal pull-through confirmed the hemorrhoids.  The scope was reinserted a short way up the left side of the colon, air was suctioned, and scope removed.  The patient tolerated the procedure well.  There was no obvious immediate complication.  ENDOSCOPIC DIAGNOSES: 1. Internal-external hemorrhoids. 2. Left-sided  sigmoid diverticula. 3. Otherwise within normal limits to the terminal ileum.  PLAN:  Repeat screening in five to 10 years if doing well medically. Otherwise, continue workup with an EGD.  Please see that dictation for further workup and plans. Dictated by:   Petra Kuba, M.D. Attending Physician:  Nelda Marseille DD:  02/17/01 TD:  02/18/01 Job: [id number]

## 2010-05-31 NOTE — Op Note (Signed)
[name], [name]               ACCOUNT NO.:  [id number]   MEDICAL RECORD NO.:  [id number]          PATIENT TYPE:  INP   LOCATION:  2005                         FACILITY:  [facility]   PHYSICIAN:  Sheliah Plane, MD    DATE OF BIRTH:  1929/12/27   DATE OF PROCEDURE:  11/12/2004  DATE OF DISCHARGE:                                 OPERATIVE REPORT   PREOPERATIVE DIAGNOSIS:  Critical aortic stenosis.   POSTOPERATIVE DIAGNOSIS:  Critical aortic stenosis.   PROCEDURE:  Aortic valve replacement with a St. Jude [place] stentless  valve, model [id number], serial number [id number].   SURGEON:  Sheliah Plane, M.D.   FIRST ASSISTANT:  Salvatore Decent. Cornelius Moras, M.D.   HISTORY:  The patient is a 77 year old female with longstanding known aortic  stenosis.  Aortic valve replacement had been recommended 18 months ago to  the patient.  However, she decided to wait and then recently presented with  increase in symptoms of congestive heart failure.  She underwent cardiac  catheterization and had luminal irregularities in the coronary vessels and a  30-40% LAD lesion but no critical stenoses and worsening aortic valve  disease.  Again, the patient agreed to aortic valve replacement.   DESCRIPTION OF PROCEDURE:  With Swan-Ganz and arterial line monitors in  place, the patient underwent general endotracheal anesthesia without  incidence.  The skin of the chest and legs was prepped with Betadine and  draped in the usual sterile manner.  A median sternotomy was performed.  The  pericardium was opened.  The patient had evidence of mild ventricular  hypertrophy.  She was systemically heparinized.  The ascending aorta and the  right atrium were cannulated.  The patient was placed on cardiopulmonary  bypass 2.4 liters per minute per m square.  The right superior pulmonary  vein vent was placed.  The patient's body temperature was cooled to 30  degrees.  Aortic cross clamp was applied and 500 mL of cold blood  potassium  cardioplegia was administered with rapid diastolic arrest of the heart.  Myocardial septal temperature was monitored throughout the cross clamp.  A  tranverse aortotomy was performed.  The aortic valve was a tricuspid aortic  valve with severe cusp aortic stenosis.  The valve was excised, and the  annulus debrided carefully taking care to remove all loose calcific debris.  The annulus was sized to a 23 mm with a sinotubular ridge slightly smaller,  approximately 21.  The coronaries were positioned such as to allow placement  of a stentless valve using a St. Jude Toronto stentless valve, model  [id number], serial [id number] was selected and 4-0 Tycron sutures were placed  in a circumferential fashion in the annulus for the proximal suture line.  With the sutures all placed, they were then placed through the lower edge of  the stent.  The stentless valve was then lowered into place and with the  valve inverted into the ventricle each of the sutures were tied and 4-0  Prolenes were then placed under the left groin coronary ostium.  The valve  struts were then positioned to approximate  the ascending aorta and 4-0  Prolene sutures were used to loosely approximate each of the struts so not  to obstruct the left or right coronary ostium.  The secondary suture line  was then completed with a running horizontal mattress Prolene sutures first  under the left coronary and then under the right and then along the  noncoronary cusp.  With these completed, the sutures proximally and the  struts to the aortic wall were also tied.  Intermittently during the  procedure, cold blood potassium cardioplegia was placed directly into the  coronary ostium.  The patient's body temperature was rewarmed to 37 degrees.  The aorta was closed with a horizontal mattress suture over felt strips.  Prior to complete closure, air was evacuated from the ascending aorta and  left ventricle.  Total aortic cross clamp  was 141 minutes.  The patient  returned to a sinus rhythm with the body temperature rewarmed to 37 degrees.  The patient was then ventilated and weaned from cardiopulmonary bypass  without difficulty. She remained hemodynamically stable, and was  decannulated in the usual fashion.  Protamine sulfate was administered.  With the operative field hemostatic, two atrial and two ventricular pacing  wires were applied.  The pericardium was loosely reapproximated.  Two  mediastinal tubes were left in place.  The sternum was closed with #6  stainless steel wire.  The fascia was closed with interrupted 0 Vicryl,  running 0 Vicryl in the subcutaneous tissue and 4-0 subcuticular stitch in  the skin edges.  Dry dressings were applied.  Sponge and needle counts were  reported as correct prior to completion of the procedure.  The patient  tolerated the procedure without obvious complication and was transferred to  the surgical intensive care unit for further postoperative care.      Sheliah Plane, MD  Electronically Signed     EG/MEDQ  D:  11/12/2004  T:  11/12/2004  Job:  [id number]   cc:   Candace Cruise, M.D.

## 2010-05-31 NOTE — Procedures (Signed)
[facility]  Patient:    [name], [name]                      MRN: [id number] Proc. Date: 01/09/00 Adm. Date:  [id number] Attending:  Evlyn Clines CC:         Beather Arbour. Thomasena Edis, M.D.  Marinda Elk, M.D.   Procedure Report  PROCEDURE:  Cystoscopy, hydrodistention of the bladder, bladder biopsy, urethral dilation and instillation of Pyridium and Marcaine.  PREOPERATIVE DIAGNOSIS:  Painful bladder.  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  SURGEON:  Excell Seltzer. Annabell Howells, M.D.  ANESTHESIA:  General.  SPECIMEN:  Bladder biopsies.  COMPLICATIONS:  None.  INDICATION:  [name] is a 77 year old white female who was sent for evaluation of significant irritative voiding symptoms.  Urodynamics revealed a small-capacity bladder without instability, but pain at 116 cc.  It was felt that hydrodistention was indicated to rule out interstitial cystitis.  FINDINGS AND PROCEDURE:  The patient was taken to the operating room where a general anesthetic was induced.  She had been given Tequin p.o. preoperatively.  She was placed in the lithotomy position.  Her perineum and genitalia were prepped with Betadine solution.  She was draped in the usual sterile fashion.  Cystoscopy was performed using the 22-French and the 12 and 70 degree lenses.  Examination revealed a normal-appearing urethra.  The bladder wall was smooth and pale.  There were a few small cysts in the trigone on the left.  On the posterior wall on the left was a stellate-appearing scar suspicious for Hunners-type ulcer.  No papillary tumors, areas suspicious for carcinoma in situ or stones were identified.  After a thorough cystoscopy was performed, the scope was removed and an 18-French Foley was inserted.  Balloon was filled with 10 cc of air.  The bladder was then filled under 80 cmH20 pressure to capacity and held for five minutes; the  bladder was then drained. The efflux was bloody.  Her capacity under anesthesia was 500 cc.  After completion of dilation, a repeat cystoscopy was performed; this demonstrated diffuse glomerular hemorrhages with some mucosal cracking in the area of the previously noted stellate scar.  Biopsies were obtained from this area around the cracking on the left and on the right lateral wall.  Once the biopsies were obtained in both, the electrode was used to fulgurate the biopsy sites. Once hemostasis was achieved, the bladder was drained.  The urethra was calibrated with female sounds to 34-French; there was some tightness at 30 and above.  Once the urethral dilation had been completed, the bladder was filled with 30 cc of 0.25% Marcaine with crushed Pyridium 400 mg.  At this point, the patient was taken down from the lithotomy position, her anesthetic was reversed and she was removed to the recovery room in stable condition.  There were no complications. DD:  01/09/00 TD:  01/09/00 Job: [id number]

## 2010-05-31 NOTE — Cardiovascular Report (Signed)
NAME:  [name], [name]                         ACCOUNT NO.:  [id number]   MEDICAL RECORD NO.:  [id number]                   PATIENT TYPE:  OIB   LOCATION:  2899                                 FACILITY:  [facility]   PHYSICIAN:  Meade Maw, M.D.                 DATE OF BIRTH:  01-06-1930   DATE OF PROCEDURE:  DATE OF DISCHARGE:  04/13/2003                              CARDIAC CATHETERIZATION   REFERRING PHYSICIAN:  Molly Maduro L. Foy Guadalajara, M.D.   INDICATION FOR PROCEDURE:  Further evaluation for calcific aortic stenosis.   DESCRIPTION OF PROCEDURE:  After obtaining written informed consent, the  patient was brought to the cardiac catheterization laboratory in the  postabsorptive state.  Preoperative sedation was achieved using IV Versed  and IV fentanyl.  The right groin was prepped and draped in the usual  sterile fashion.  Local anesthesia was achieved using 1% Xylocaine.  A 6-  [place] hemostasis sheath was placed into the right femoral artery using the  modified Seldinger technique.  Selective coronary artery was performed using  a JL-4, JR-4 Judkins catheter.  Multiple views were obtained.  All catheter  exchanges were made over a guidewire.  The right coronary catheter was  placed across the left ventricle.  This catheter was exchanged for a 6-  French pigtail curved catheter.  Hemostasis was achieved.  Sheath was  flushed each catheter exchange.   FINDINGS:  1. The aortic pressure was 115/53.  2. LV pressure was 155/7.  The EDP was 12.  The mean gradient was 38 mmHg.  3. The peak gradient was 53 mmHg.  4. The calculated aortic valve area, single plane ventriculogram revealed     normal wall motion, ejection fraction of 65%.  5. Fluoroscopy  revealed significant calcification of the proximal vessel.   CORONARY ARTERIES:  LEFT MAIN CORONARY ARTERY:  Bifurcates into the left  anterior descending and the circumflex vessel.  There was no disease noted  in the left main coronary  artery.   LEFT ANTERIOR DESCENDING:  [place] Nail gives to a small-to-moderate D1, moderate  D2, small D3.  It goes on to end as an __________ branch.  There are luminal  irregularities in the left anterior descending only.   CIRCUMFLEX VESSEL:  The circumflex vessel is a moderate-to-large caliber  vessel.  It gives rise to a small-to-moderate OM1, small OM2, large  bifurcating OM3, and ends as an __________ vessel.  There is no disease  noted in the circumflex or its branches.   RIGHT CORONARY ARTERY:  Dominant for the posterior circulation. It gives  rise to three RV marginal, PDA and large PL branch. There is no disease  noted in the right coronary artery or its branches.  On simultaneous  pressure, the mean gradient was 38 mmHg.  The peak gradient was 53 mmHg.  The calculated aortic valve area was 1.0 cm squared.   FINAL IMPRESSION:  1.  Normal single plane ventriculogram and ejection fraction of 60%.  2. Severe to critical calcific aortic stenosis.  3. Luminal irregularities only.  4. These findings will be reviewed with the patient.  Aortic valve     replacement will be considered in view of the patient's symptomatic     status.                                               Meade Maw, M.D.    HP/MEDQ  D:  04/13/2003  T:  04/14/2003  Job:  [id number]

## 2010-05-31 NOTE — Op Note (Signed)
[name], [name]               ACCOUNT NO.:  [id number]   MEDICAL RECORD NO.:  [id number]          PATIENT TYPE:  INP   LOCATION:  2314                         FACILITY:  [facility]   PHYSICIAN:  Burna Forts, M.D.DATE OF BIRTH:  03-17-1929   DATE OF PROCEDURE:  11/08/2004  DATE OF DISCHARGE:                                 OPERATIVE REPORT   PROCEDURE:  Intraoperative transesophageal echocardiogram.   INDICATIONS FOR PROCEDURE:  [name] is a 77 year old female, who  presents today for aortic valve replacement by Dr. Ofilia Neas.   The patient was brought from the holding area the morning of surgery where  under local anesthesia with sedation, pulmonary artery catheter and arterial  lines are placed.  She is then taken to the OR for routine induction of  general anesthesia.  After tracheal intubation, the TEE probe is protected  then passed oropharyngeally in the stomach then slightly withdrawn for  imaging in the cardiac structures.   PRE-CARDIOPULMONARY TEE BYPASS EXAMINATION:  1.  Left ventricle:  The left ventricular chamber seems to be a      concentrically hypertrophied left ventricular chamber in a short-axis      view.  The left long and short-axis views revealed good overall      contractility and a good overall contractile pattern with significant      concentric hypertrophy noted in all segments.  The upper layering      muscles are well-outlined and seen.  There are no masses noted within.  2.  Mitral valve:  The left leaflets, anterior and posterior, are thickened      somewhat.  They do appear to move satisfactorily during diastolic inflow      and close appropriately during systolic contraction.  Doppler      examination reveals only a trace regurgitant flow centrally located.      There is calcium seen in one aspect of the posterior leaflet as it meets      the posterior wall and lateral wall in the annular area.  The annular      area also contains  calcium.  3.  Left atrium:  Normal left atrial chamber in size and function.  The      interatrial septum is interrogated and is intact.  The appendage is seen      and it is also clear.  4.  Aortic valve:  Of note immediately upon view of the aortic valve      indicates a heavily calcified aortic valve.  There do appear to be three      cusps all heavily calcified.  Seen in a short-axis view, there is      calcium along the raphe and edges of all valves, left, right, and      noncoronary cusp.  In the short-axis view planimetry gives an estimated      orifice of about 0.5 cm cubed or area.  The calculated gradients are      such that the gradient across the valve is considered to be around 77      mmHg  with a mean around 56.  Calculated aortic valve area then comes out      to be about 0.7 cm cubed in this area orifice.  This would be consistent      with severe aortic root stenosis.  A long-axis view across the aortic      valve again reveals a very narrow, turbulent jet through the center      portion of the orifice with a turbulent jet seen well into the ascending      aorta.  There is also noted a trace of aortic regurgitant flow noted      during diastole.  5.  Right ventricle:  Normal trabeculated, normal functioning, right      ventricular chamber.  6.  Tricuspid valve:  Thin, compliant, mobile.  Normal tricuspid valve.  7.  Right atrium:  Normal right atrial chamber. A pulmonary catheter is      noted within.   The diseased aortic valves were excised by Dr. Tyrone Sage then replaced with a  stentless tissue valve.  De-airing maneuvers were carried out, and the  patient was prepared for separation from cardiopulmonary bypass, which was  successful with the initial attempt.   POST-CARDIOPULMONARY BYPASS EXAMINATION (LIMITED EXAM):  1.  Aortic valve in place of the diseased aortic valve cannot be seen.  The      very fine leaflet structures and periphery of the stentless valve  that      was well-seated, well-placed, seen in both short and long-axis views,      again indicating entirely normal function, normal flow across this      valve, and no aortic insufficiency appreciated.  2.  Left ventricle:  The left ventricular chamber was seen.  Again,      concentric left hypertrophy but with good overall left ventricular      contractility.   She did well postop and was returned to the cardiac intensive care unit in  stable condition.  The rest of the cardiac exam was as previously described.           ______________________________  Burna Forts, M.D.     JTM/MEDQ  D:  11/08/2004  T:  11/09/2004  Job:  [id number]

## 2010-05-31 NOTE — Discharge Summary (Signed)
[name], [name]               ACCOUNT NO.:  [id number]   MEDICAL RECORD NO.:  [id number]          PATIENT TYPE:  INP   LOCATION:  2005                         FACILITY:  [facility]   PHYSICIAN:  Sheliah Plane, MD    DATE OF BIRTH:  1929/08/31   DATE OF ADMISSION:  11/08/2004  DATE OF DISCHARGE:  11/14/2004                                 DISCHARGE SUMMARY   ADDENDUM:  This is an addendum to #[id number].  Initial discharge summary was  dictated on November 12, 2004, with anticipation that the patient would be  discharged home on November 13, 2004, however, during morning rounds,  patient reported an episode of nausea with dry heaving associated with some  tingling in both arms which she said developed after a pill got stuck in the  back of her throat and made her anxious.  This occurred around 6 a.m. and by  morning rounds the tingling had resolved.  In regard to her nausea, it was  felt that this was most likely related to her high dose of amiodarone as she  was on 400 mg tablets three times a day.  She maintained sinus rhythm.  Subsequently her amiodarone was decreased to 200 mg daily.  Otherwise she  remained stable.  It was felt that she should remain hospitalized for an  additional day for observation.  The following day, postoperative day #6,  November 14, 2004, [name] reported resolution of her nausea and denied  any further episodes of tingling.  She remained stable and afebrile with  blood pressure 128/73, heart rate with sinus rhythm in the 70s to 80s,  ranging oxygen saturation 92% on room air.  Her weight was actually below  her baseline and diuretic therapy for her mild postoperative fluid volume  excess was discontinued.  Renal function remained stable with creatinine  0.7.  Her heart has regular rate and rhythm, lungs clear.  Abdominal exam  benign.  Extremities without edema and incisions were healing well without  signs of infection.  Subsequently, it was felt she was  now stable for  discharge home on November 14, 2004.   DISCHARGE LABORATORY DATA:  Sodium 138, potassium 3.8, blood glucose 95, BUN  7, creatinine 0.7.  White cell count 7.3, hemoglobin 10.3, hematocrit 29,  platelet count 160.   UPDATED DISCHARGE MEDICATIONS:  1.  Potassium chloride 20 mEq daily.  2.  Aspirin 325 mg daily.  3.  Toprol XL 50 mg daily.  4.  Lisinopril 40 mg daily.  5.  Vytorin 10/40 mg daily.  6.  Coenzyme Q 200 mg daily.  7.  Levothyroxine 112 mcg daily.  8.  Folic acid 1 mg daily.  9.  Vitamin B12 100 mcg daily.  10. Amiodarone 200 mg daily.  11. Hydrochlorothiazide 25 mg daily.  12. Vitamin E 8000 units daily.  13. Calcium plus vitamin D one daily at home dose.  14. Tylox one to two tablets p.o. q.4-6h. p.r.n. pain.   DISCHARGE INSTRUCTIONS:  As previously dictated.  Please refer to previous  discharge summary as well for admission diagnoses, etc.  Jerold Coombe, P.A.      Sheliah Plane, MD  Electronically Signed    AWZ/MEDQ  D:  12/31/2004  T:  01/02/2005  Job:  [id number]   cc:   Molly Maduro L. Foy [place], M.D.  Fax: [phone]   Meade Maw, M.D.  Fax: [phone]

## 2010-05-31 NOTE — Discharge Summary (Signed)
[name], [name]               ACCOUNT NO.:  [id number]   MEDICAL RECORD NO.:  [id number]          PATIENT TYPE:  INP   LOCATION:  2005                         FACILITY:  [facility]   PHYSICIAN:  Sheliah Plane, MD    DATE OF BIRTH:  Apr 29, 1929   DATE OF ADMISSION:  11/08/2004  DATE OF DISCHARGE:                                 DISCHARGE SUMMARY   PRIMARY DIAGNOSIS:  Aortic valve stenosis.   IN-HOSPITAL DIAGNOSES:  1.  Postoperative atrial fibrillation.  2.  Volume overload.  3.  Hypokalemia.  4.  Acute blood loss anemia postoperatively.   OPERATIONS AND PROCEDURES:  Aortic valve replacement with 23 mm stent-less  aortic tissue value.   SECONDARY DIAGNOSES:  1.  Hypothyroidism.  2.  History of arthritis.  3.  Status post cholecystectomy.  4.  Status post appendectomy.   ALLERGIES:  The patient is allergic to CELEBREX and CODEINE.   HISTORY AND PHYSICAL:  The patient is a 77 year old female who had a known  murmur for 10 to 11 years and has had intermittent echocardiograms.  The  patient was seen back in May 2005 having increased shortness of breath with  exertion only.  At that time, aortic valve replacement was recommended.  Prior to undergoing surgery, the patient decided to seek alternative  treatment and underwent 1 year of chelation therapy.  Following treatment,  the patient continued to have episodic symptoms of chest discomfort, was  seen by Dr. Fraser Din September 24, 2004.  The patient underwent cardiac  catheterization. This confirmed clinical aortic stenosis with calculated  valve area of 0.8, peak gradient 46, and mean 36.  She had luminal  irregularities in her coronaries but no high-grade stenosis. The patient was  referred back to Dr. Tyrone Sage.  Dr. Tyrone Sage discussed with patient  undergoing aortic valve replacement.  He discussed risks and benefits of  this procedure.  She acknowledged her understanding and agreed to proceed.  She was scheduled for surgery  for November 08, 2004.  She did have  preoperative bilateral carotid duplex ultrasound showing no significant ICA  stenosis.   HOSPITAL COURSE:  The patient was taken to the operating room on October 09, 2004, where she underwent aortic valve replacement using a 23 mm  stentless aortic tissue valve.  The patient tolerated the procedure well and  transferred to the intensive care unit in stable condition.  Immediately  postoperatively, she was seen to be hemodynamically stable.  She was  extubated on the evening of surgery and continued to do well.  Following  extubation, the patient was seen to be neurologically intact.   The patient's postoperative course was complicated by postoperative atrial  fibrillation.  She was started on amiodarone and converted back to normal  sinus rhythm.  She remained in normal sinus rhythm the remainder of her  hospital stay.  She did develop volume overload and was started on  diuretics.  The patient was transferred up to 2000 on postop day #3.  She  was out of bed and ambulating well.  She was able to be weaned from oxygen,  saturating greater than 90% on room air.  Her incisions remained dry and  intact and healing well.  She remained hemodynamically stable.  Creatinine  remained stable. The patient was tolerating regular diet.   The patient was said to be ready for discharge November 13, 2004, postop day  #5.  A followup appointment is scheduled with Dr. Tyrone Sage for December 12, 2004, at 11 a.m.  The patient is to follow up at Dr. Lindell Spar office in two  weeks.  She is to call to schedule and appointment.  At that appointment,  she will have a PA and lateral chest x-ray done which she will then bring  with her to Dr. Dennie Maizes office.  [name] received instructions on  diet, activity, and incisional care.  She was told no driving until released  to do so, no heavy lifting over 10 pounds.  She was told she was allowed to  shower, washing her  incision using soap and water. She is to contact the  office if she develops any drainage or openings from any of her incision  sites.  She is told to ambulate 3 to 4 times a day, progress as tolerated.  She is told to continue exercises.  The patient was educated on diet to be  low-fat, low-salt.  The patient acknowledged her understanding.   DISCHARGE MEDICATIONS:  1.  Toprol XL 25 mg p.o. daily.  2.  Lisinopril 40 mg p.o. daily.  3.  Vitorin 10/40 daily.  4.  Coenzyme-Q 200 mg daily.  5.  Levothyroxine 112 mcg daily.  6.  Folic acid 1 mg daily.  7.  Vitamin B12 1000 mcg daily.  8.  Amiodarone 400 mg 3 times a day x5 days.  9.  Hydrochlorothiazide 25 mg daily.  10. Verapamil 180 mg twice daily.  11. Vitamin A 8000 mg daily.  12. Tylox 1 to 2 tablets p.o. q.4-6 h. p.r.n. pain.      [name], [place]      Sheliah Plane, MD  Electronically Signed    KMD/MEDQ  D:  11/12/2004  T:  11/12/2004  Job:  [id number]   cc:   Meade Maw, M.D.  Fax: [phone]

## 2010-05-31 NOTE — Cardiovascular Report (Signed)
NAMEJERMANI, PUND               ACCOUNT NO.:  [id number]   MEDICAL RECORD NO.:  [id number]          PATIENT TYPE:  OIB   LOCATION:  2852                         FACILITY:  [facility]   PHYSICIAN:  Meade Maw, M.D.    DATE OF BIRTH:  1929-03-14   DATE OF PROCEDURE:  DATE OF DISCHARGE:                              CARDIAC CATHETERIZATION   HISTORY:  [name] is a pleasant 77 year old female who was initially  evaluated, in March 2005, for a calcific aortic stenosis.  At this time she  was found to have an aortic valve of 1.0.  The patient did not wish to  proceed with aortic valve replacement secondary to significant concerns  regarding her sister's previous valve replacement.  She now presents with  recurrent chest pain which she thinks may be related to her hiatal hernia;  however, she has had two ER presentations for chest pain since March 2005.   PROCEDURE:  After obtaining written informed consent, the patient was  brought to the cardiac catheterization lab in the post absorptive state.  Pre-op sedation was achieved using Versed 2 mg IV, Fentanyl 50 mcg IV.  The  right groin was prepped and draped in the usual sterile fashion.  Local  anesthesia was achieved using 1% Xylocaine.  A 7-French hemostasis sheath  was placed into the right femoral artery using a modified test modified  Seldinger technique.  Selective coronary angiography was performed using a  JL-4, JR-4 Judkins catheter.  Multiple views were obtained.  All catheter  exchanges were made over a guidewire.  The aortic valve was crossed using a  JR-4 [place].  An exchange wire was placed and a 6-French pigtail curved  catheter was placed into the left ventricular chamber using a J wire  exchange.  There was no immediate complication.  The patient was transferred  to the holding area.  Hemostasis was achieved using a FemoStop device.   FINDINGS:  1.  The LV pressure was 171/17 with an EDP of 23.  2.  The LV was 125/53.  3.   The peak gradient was 46 with a mean gradient of 36 noted.  Of note, by      transthoracic echo a peak gradient was noted to be 74-mmHg with a mean      gradient of 45-mmHg.  The calculated aortic valve area was 0.8-cm2.   FLUOROSCOPY:  Revealed mild calcification of the proximal left anterior  descending.   CORONARY ANGIOGRAPHY:  1.  The left main coronary artery bifurcates into the left anterior      descending and circumflex vessel.  There is no significant disease noted      in the left main coronary artery.  2.  The left anterior descending.  The left anterior descending gives rise      to a trivial diagonal, a larger D-2 goes on as an apical branch.  There      is a 30-40% mid vessel lesion noted in the left anterior descending.  3.  Circumflex vessel.  The circumflex vessel was a large caliber vessel.  It gives rise to a moderate OM-1, trivial OM-2, moderate OM-3.  It goes      on as an AV groove vessel.  There is no disease noted in the circumflex      or its branches.  4.  The right coronary artery.  The right coronary artery was a large      dominant artery giving rise to PDA and APL branch. There was luminal      irregularities in the right coronary artery and its branches.   FINAL IMPRESSION:  1.  Severe critical aortic stenosis with a calculated aortic valve area 0.8-      cm2 by transthoracic echo.  2.  There was a mean gradient of 45-mm by transthoracic echo.  The mean      gradient by left heart catheterization was 36-mmHg.  3.  Noncritical disease noted in the mid left anterior descending.  4.  Preserved left ventricular function.  5  The patient will be returned to Dr. Tyrone Sage for consideration for aortic  valve replacement      Meade Maw, M.D.  Electronically Signed     HP/MEDQ  D:  09/24/2004  T:  09/24/2004  Job:  [id number]

## 2010-10-08 LAB — DIFFERENTIAL
Basophils Absolute: 0
Basophils Relative: 1
Eosinophils Absolute: 0.2
Eosinophils Relative: 3
Lymphocytes Relative: 38
Lymphs Abs: 2.6
Monocytes Absolute: 0.7
Monocytes Relative: 10
Neutro Abs: 3.3
Neutrophils Relative %: 48

## 2010-10-08 LAB — URINALYSIS, MICROSCOPIC ONLY
Glucose, UA: NEGATIVE
Ketones, ur: 40 — AB
Nitrite: NEGATIVE
Protein, ur: 30 — AB
Specific Gravity, Urine: 1.033 — ABNORMAL HIGH
Urobilinogen, UA: 1
pH: 5.5

## 2010-10-08 LAB — BASIC METABOLIC PANEL
BUN: 4 — ABNORMAL LOW
CO2: 26
CO2: 27
CO2: 30
Calcium: 9
Calcium: 9.3
Chloride: 104
Chloride: 105
Creatinine, Ser: 0.59
Creatinine, Ser: 0.6
GFR calc Af Amer: >60
GFR calc Af Amer: >60
Glucose, Bld: 119 — ABNORMAL HIGH
Glucose, Bld: 121 — ABNORMAL HIGH
Sodium: 138

## 2010-10-08 LAB — CARDIAC PANEL(CRET KIN+CKTOT+MB+TROPI)
CK, MB: 4.1 — ABNORMAL HIGH
CK, MB: 4.6 — ABNORMAL HIGH
Relative Index: INVALID
Relative Index: INVALID
Total CK: 42
Total CK: 47
Troponin I: 0.04
Troponin I: 0.05

## 2010-10-08 LAB — COMPREHENSIVE METABOLIC PANEL
ALT: 10
AST: 21
Albumin: 3.4 — ABNORMAL LOW
Alkaline Phosphatase: 68
BUN: 11
CO2: 28
Calcium: 8.8
Chloride: 103
Creatinine, Ser: 0.63
GFR calc Af Amer: >60
GFR calc non Af Amer: >60
Glucose, Bld: 118 — ABNORMAL HIGH
Potassium: 3.6
Sodium: 137
Total Bilirubin: 0.7
Total Protein: 5.8 — ABNORMAL LOW

## 2010-10-08 LAB — URINE CULTURE
Colony Count: 40000
Special Requests: NEGATIVE

## 2010-10-08 LAB — CBC
HCT: 38.8
Hemoglobin: 13.5
MCHC: 34.7
MCV: 102.3 — ABNORMAL HIGH
Platelets: 229
RBC: 3.79 — ABNORMAL LOW
RDW: 12.3
WBC: 6.8

## 2010-10-21 LAB — BASIC METABOLIC PANEL
BUN: 4 — ABNORMAL LOW
BUN: 8
CO2: 25
CO2: 27
CO2: 28
Calcium: 8.4
Calcium: 8.5
Chloride: 88 — ABNORMAL LOW
Creatinine, Ser: 0.69
GFR calc non Af Amer: >60
GFR calc non Af Amer: >60
Glucose, Bld: 107 — ABNORMAL HIGH
Glucose, Bld: 115 — ABNORMAL HIGH
Glucose, Bld: 99
Potassium: 3.5
Sodium: 114 — CL

## 2010-10-21 LAB — URINALYSIS, ROUTINE W REFLEX MICROSCOPIC
Bilirubin Urine: NEGATIVE
Glucose, UA: 500 — AB
Ketones, ur: NEGATIVE
Nitrite: NEGATIVE
pH: 7

## 2010-10-21 LAB — DIFFERENTIAL
Basophils Absolute: 0
Eosinophils Relative: 1
Lymphocytes Relative: 33
Monocytes Absolute: 0.7
Monocytes Relative: 9

## 2010-10-21 LAB — CBC
HCT: 37.7
Hemoglobin: 13.1
RDW: 12

## 2010-10-21 LAB — URINE MICROSCOPIC-ADD ON

## 2010-10-21 LAB — URINE CULTURE

## 2010-10-24 LAB — DIFFERENTIAL
Basophils Absolute: 0
Basophils Relative: 0
Eosinophils Absolute: 0
Monocytes Absolute: 0.3
Monocytes Relative: 4
Neutro Abs: 5.5
Neutrophils Relative %: 73

## 2010-10-24 LAB — POCT I-STAT CREATININE: Creatinine, Ser: 0.6

## 2010-10-24 LAB — BASIC METABOLIC PANEL
BUN: 6
CO2: 25
Calcium: 9
Chloride: 99
Creatinine, Ser: 0.68
GFR calc Af Amer: >60
GFR calc non Af Amer: >60
Glucose, Bld: 90
Potassium: 5.1
Sodium: 132 — ABNORMAL LOW

## 2010-10-24 LAB — COMPREHENSIVE METABOLIC PANEL
ALT: 15
ALT: 19
AST: 19
AST: 23
Albumin: 3.2 — ABNORMAL LOW
Albumin: 3.6
Alkaline Phosphatase: 50
Alkaline Phosphatase: 61
BUN: 7
BUN: 7
CO2: 26
CO2: 27
Calcium: 8.6
Calcium: 8.8
Chloride: 85 — ABNORMAL LOW
Chloride: 91 — ABNORMAL LOW
Creatinine, Ser: 0.72
Creatinine, Ser: 0.85
GFR calc Af Amer: >60
GFR calc Af Amer: >60
GFR calc non Af Amer: >60
GFR calc non Af Amer: >60
Glucose, Bld: 107 — ABNORMAL HIGH
Glucose, Bld: 95
Potassium: 3.7
Potassium: 3.8
Sodium: 120 — ABNORMAL LOW
Sodium: 125 — ABNORMAL LOW
Total Bilirubin: 0.9
Total Bilirubin: 1.3 — ABNORMAL HIGH
Total Protein: 5.1 — ABNORMAL LOW
Total Protein: 5.9 — ABNORMAL LOW

## 2010-10-24 LAB — I-STAT 8, (EC8 V) (CONVERTED LAB)
Acid-Base Excess: 4 — ABNORMAL HIGH
BUN: 8
Chloride: 80 — ABNORMAL LOW
HCT: 40
Hemoglobin: 13.6
Operator id: 277751
Potassium: 3.3 — ABNORMAL LOW
Sodium: 112 — CL

## 2010-10-24 LAB — URINALYSIS, ROUTINE W REFLEX MICROSCOPIC
Bilirubin Urine: NEGATIVE
Nitrite: NEGATIVE
Protein, ur: NEGATIVE
Specific Gravity, Urine: 1.013
Urobilinogen, UA: 0.2

## 2010-10-24 LAB — CBC
Hemoglobin: 12.8
MCHC: 35.8
MCV: 97.8
RDW: 12.3

## 2010-10-24 LAB — PHOSPHORUS
Phosphorus: 2.7
Phosphorus: 2.9

## 2010-10-24 LAB — MAGNESIUM
Magnesium: 1.7
Magnesium: 2.1

## 2010-10-24 LAB — URINE MICROSCOPIC-ADD ON

## 2010-10-25 LAB — URINALYSIS, ROUTINE W REFLEX MICROSCOPIC
Glucose, UA: NEGATIVE
Ketones, ur: NEGATIVE
Leukocytes, UA: NEGATIVE
Protein, ur: NEGATIVE
Urobilinogen, UA: 1

## 2010-10-25 LAB — BASIC METABOLIC PANEL
BUN: 8
CO2: 30
Calcium: 9.3
Chloride: 87 — ABNORMAL LOW
Creatinine, Ser: 0.73
GFR calc Af Amer: >60
Glucose, Bld: 151 — ABNORMAL HIGH

## 2010-10-25 LAB — DIFFERENTIAL
Basophils Absolute: 0
Basophils Relative: 0
Eosinophils Absolute: 0
Monocytes Relative: 3
Neutro Abs: 4.2
Neutrophils Relative %: 76

## 2010-10-25 LAB — D-DIMER, QUANTITATIVE: D-Dimer, Quant: 0.34

## 2010-10-25 LAB — POCT CARDIAC MARKERS
CKMB, poc: 1.3
Operator id: 4534
Troponin i, poc: <0.05

## 2010-10-25 LAB — CBC
MCHC: 35.2
MCV: 99
Platelets: 260
RBC: 3.8 — ABNORMAL LOW
RDW: 11.7

## 2010-10-25 LAB — URINE MICROSCOPIC-ADD ON

## 2013-01-13 DIAGNOSIS — Z9289 Personal history of other medical treatment: Secondary | ICD-10-CM

## 2013-01-13 HISTORY — DX: Personal history of other medical treatment: Z92.89

## 2013-03-04 ENCOUNTER — Encounter: Payer: Self-pay | Admitting: Cardiology

## 2013-03-04 DIAGNOSIS — I10 Essential (primary) hypertension: Secondary | ICD-10-CM

## 2013-03-04 DIAGNOSIS — E785 Hyperlipidemia, unspecified: Secondary | ICD-10-CM | POA: Insufficient documentation

## 2013-03-04 DIAGNOSIS — Z954 Presence of other heart-valve replacement: Secondary | ICD-10-CM | POA: Insufficient documentation

## 2013-03-08 ENCOUNTER — Ambulatory Visit: Payer: Self-pay | Admitting: Interventional Cardiology

## 2013-03-29 ENCOUNTER — Other Ambulatory Visit: Payer: Self-pay | Admitting: Interventional Cardiology

## 2013-04-26 ENCOUNTER — Ambulatory Visit (INDEPENDENT_AMBULATORY_CARE_PROVIDER_SITE_OTHER): Payer: Medicare Other | Admitting: Physician Assistant

## 2013-04-26 ENCOUNTER — Encounter: Payer: Self-pay | Admitting: Physician Assistant

## 2013-04-26 VITALS — BP 120/81 | HR 69 | Ht 64.0 in | Wt 114.0 lb

## 2013-04-26 DIAGNOSIS — I10 Essential (primary) hypertension: Secondary | ICD-10-CM

## 2013-04-26 DIAGNOSIS — E785 Hyperlipidemia, unspecified: Secondary | ICD-10-CM

## 2013-04-26 DIAGNOSIS — Z954 Presence of other heart-valve replacement: Secondary | ICD-10-CM

## 2013-04-26 DIAGNOSIS — I359 Nonrheumatic aortic valve disorder, unspecified: Secondary | ICD-10-CM

## 2013-04-26 MED ORDER — ATORVASTATIN CALCIUM 40 MG PO TABS: 40.0000 mg | ORAL_TABLET | Freq: Every day | ORAL | Status: DC

## 2013-04-26 NOTE — Patient Instructions (Addendum)
Your physician has requested that you have an echocardiogram. Echocardiography is a painless test that uses sound waves to create images of your heart. It provides your doctor with information about the size and shape of your heart and how well your heart's chambers and valves are working. This procedure takes approximately one hour. There are no restrictions for this procedure.  Your physician recommends that you continue on your current medications as directed. Please refer to the Current Medication list given to you today.  Your physician wants you to follow-up in: 1 year with Dr. Irish Lack. You will receive a reminder letter in the mail two months in advance. If you don't receive a letter, please call our office to schedule the follow-up appointment.

## 2013-04-26 NOTE — Progress Notes (Signed)
[facility], [place]  [address] Phone: [phone] Fax:  [phone]  Date:  04/26/2013   ID:  Johanne [place]ade [place], DOB 02/17/1929, MRN [id number]  PCP:  Abigail Miyamoto, MD  Cardiologist:  Dr. Casandra Doffing      History of Present Illness: [name] is a 80 y.o. female with a hx of AS, s/p bioprosthetic AVR 10/2004, perioperative AFib, HTN, HL, LBBB, hypothyroidism.  Last seen 02/2012.  She returns for annual follow up. She is doing well. She denies chest pain or significant dyspnea. She sleeps on 1- 2 pillows chronically. She denies PND. She denies significant edema. She denies syncope.  Studies:  - LHC (10/2004):  No significant CAD  - Echo (12/15/06):  EF 60-65%, aVR okay, MAC, mild MR, mild TR   Recent Labs: No results found for requested labs within last 365 days. 12/23/2012: Hgb 14.4, BUN 8, K 4.6, ALT 18, TSH 0.1  Wt Readings from Last 3 Encounters:  04/26/13 114 lb (51.71 kg)     Past Medical History  Diagnosis Date  . Hypertension   . Hyperlipidemia   . Thyroid disease     hypothyroidism   . Chronic constipation   . A-fib     perioperative, no recurrence  . Aortic valve prosthesis present   . History of echocardiogram 2008    intact LVEF, 65% nl AVR fxn, mod bileaflet MV thickening    Current Outpatient Prescriptions  Medication Sig Dispense Refill  . aspirin 81 MG tablet Take 81 mg by mouth daily.      Marland Kitchen atorvastatin (LIPITOR) 40 MG tablet TAKE 1 TABLET BY MOUTH EVERY DAY  30 tablet  1  . augmented betamethasone dipropionate (DIPROLENE-AF) 0.05 % ointment Apply topically 2 (two) times daily.      Marland Kitchen levothyroxine (SYNTHROID, LEVOTHROID) 75 MCG tablet Take 75 mcg by mouth daily before breakfast. Only 5 days a week--none on sat/sun      . lisinopril (PRINIVIL,ZESTRIL) 40 MG tablet Take 40 mg by mouth daily.      . metoprolol (LOPRESSOR) 50 MG tablet Take 50 mg by mouth 2 (two) times daily.       No current facility-administered  medications for this visit.    Allergies:   Bactrim; Celebrex; and Codeine   Social History:  The patient  reports that she has never smoked. She does not have any smokeless tobacco history on file. She reports that she does not drink alcohol or use illicit drugs.   Family History:  The patient's family history includes Heart disease in her father and mother.   ROS:  Please see the history of present illness.      All other systems reviewed and negative.   PHYSICAL EXAM: VS:  BP 120/81  Pulse 69  Ht 5\' 4"  (1.626 m)  Wt 114 lb (51.71 kg)  BMI 19.56 kg/m2 Well nourished, well developed, in no acute distress HEENT: normal Neck: no JVD Cardiac:  normal S1, S2; RRR; no murmur Lungs:  clear to auscultation bilaterally, no wheezing, rhonchi or rales Abd: soft, nontender, no hepatomegaly Ext: no edema Skin: warm and dry Neuro:  CNs 2-12 intact, no focal abnormalities noted  EKG:  NSR, HR 69, LBBB     ASSESSMENT AND PLAN:  1. Aortic Stenosis , status post AVR: Overall stable. Continue current therapy. Continue SBE prophylaxis. Arrange follow up echocardiogram. 2. Hypertension: Controlled. 3. Hyperlipidemia: Continue statin. 4. Disposition: Follow up with Dr. Casandra Doffing  in 1 year.   Signed, Richardson Dopp, PA-C  04/26/2013 12:49 PM

## 2013-04-28 ENCOUNTER — Ambulatory Visit: Payer: Self-pay | Admitting: Interventional Cardiology

## 2013-05-04 ENCOUNTER — Encounter: Payer: Self-pay | Admitting: Cardiovascular Disease

## 2013-05-04 ENCOUNTER — Ambulatory Visit (HOSPITAL_COMMUNITY): Payer: Medicare Other | Attending: Cardiovascular Disease | Admitting: Radiology

## 2013-05-04 DIAGNOSIS — I359 Nonrheumatic aortic valve disorder, unspecified: Secondary | ICD-10-CM

## 2013-05-04 DIAGNOSIS — Z954 Presence of other heart-valve replacement: Secondary | ICD-10-CM | POA: Insufficient documentation

## 2013-05-04 NOTE — Progress Notes (Signed)
Echocardiogram performed.  

## 2013-05-05 ENCOUNTER — Encounter: Payer: Self-pay | Admitting: Physician Assistant

## 2013-05-05 ENCOUNTER — Telehealth: Payer: Self-pay | Admitting: *Deleted

## 2013-05-05 NOTE — Telephone Encounter (Signed)
s/w pt's son ok per pt. Son has been advised of echo results with verbal understanding.

## 2013-08-24 ENCOUNTER — Encounter (HOSPITAL_COMMUNITY): Payer: Self-pay | Admitting: Emergency Medicine

## 2013-08-24 ENCOUNTER — Inpatient Hospital Stay (HOSPITAL_COMMUNITY)
Admission: EM | Admit: 2013-08-24 | Discharge: 2013-08-27 | DRG: 378 | Disposition: A | Discharge status: Home / Self Care | Payer: Medicare Other | Attending: Internal Medicine | Admitting: Internal Medicine

## 2013-08-24 ENCOUNTER — Emergency Department (HOSPITAL_COMMUNITY): Payer: Medicare Other

## 2013-08-24 DIAGNOSIS — K5900 Constipation, unspecified: Secondary | ICD-10-CM | POA: Diagnosis present

## 2013-08-24 DIAGNOSIS — Z952 Presence of prosthetic heart valve: Secondary | ICD-10-CM | POA: Diagnosis not present

## 2013-08-24 DIAGNOSIS — Z7401 Bed confinement status: Secondary | ICD-10-CM

## 2013-08-24 DIAGNOSIS — Z79899 Other long term (current) drug therapy: Secondary | ICD-10-CM

## 2013-08-24 DIAGNOSIS — Z7982 Long term (current) use of aspirin: Secondary | ICD-10-CM | POA: Diagnosis not present

## 2013-08-24 DIAGNOSIS — L89109 Pressure ulcer of unspecified part of back, unspecified stage: Secondary | ICD-10-CM | POA: Diagnosis present

## 2013-08-24 DIAGNOSIS — I359 Nonrheumatic aortic valve disorder, unspecified: Secondary | ICD-10-CM

## 2013-08-24 DIAGNOSIS — Z954 Presence of other heart-valve replacement: Secondary | ICD-10-CM

## 2013-08-24 DIAGNOSIS — I959 Hypotension, unspecified: Secondary | ICD-10-CM

## 2013-08-24 DIAGNOSIS — E039 Hypothyroidism, unspecified: Secondary | ICD-10-CM | POA: Diagnosis present

## 2013-08-24 DIAGNOSIS — E785 Hyperlipidemia, unspecified: Secondary | ICD-10-CM

## 2013-08-24 DIAGNOSIS — I10 Essential (primary) hypertension: Secondary | ICD-10-CM

## 2013-08-24 DIAGNOSIS — L89152 Pressure ulcer of sacral region, stage 2: Secondary | ICD-10-CM

## 2013-08-24 DIAGNOSIS — Z881 Allergy status to other antibiotic agents status: Secondary | ICD-10-CM | POA: Diagnosis not present

## 2013-08-24 DIAGNOSIS — D126 Benign neoplasm of colon, unspecified: Secondary | ICD-10-CM | POA: Diagnosis present

## 2013-08-24 DIAGNOSIS — K922 Gastrointestinal hemorrhage, unspecified: Secondary | ICD-10-CM

## 2013-08-24 DIAGNOSIS — Z8249 Family history of ischemic heart disease and other diseases of the circulatory system: Secondary | ICD-10-CM

## 2013-08-24 DIAGNOSIS — E440 Moderate protein-calorie malnutrition: Secondary | ICD-10-CM | POA: Diagnosis present

## 2013-08-24 DIAGNOSIS — K5731 Diverticulosis of large intestine without perforation or abscess with bleeding: Principal | ICD-10-CM | POA: Diagnosis present

## 2013-08-24 DIAGNOSIS — L8991 Pressure ulcer of unspecified site, stage 1: Secondary | ICD-10-CM | POA: Diagnosis present

## 2013-08-24 DIAGNOSIS — R64 Cachexia: Secondary | ICD-10-CM | POA: Diagnosis present

## 2013-08-24 DIAGNOSIS — K625 Hemorrhage of anus and rectum: Secondary | ICD-10-CM

## 2013-08-24 DIAGNOSIS — L89151 Pressure ulcer of sacral region, stage 1: Secondary | ICD-10-CM | POA: Diagnosis present

## 2013-08-24 DIAGNOSIS — Z888 Allergy status to other drugs, medicaments and biological substances status: Secondary | ICD-10-CM

## 2013-08-24 DIAGNOSIS — K921 Melena: Secondary | ICD-10-CM | POA: Diagnosis present

## 2013-08-24 DIAGNOSIS — I9589 Other hypotension: Secondary | ICD-10-CM

## 2013-08-24 LAB — CBC
HEMATOCRIT: 40.6 % (ref 36.0–46.0)
Hemoglobin: 14 g/dL (ref 12.0–15.0)
MCH: 34.5 pg — AB (ref 26.0–34.0)
MCHC: 34.5 g/dL (ref 30.0–36.0)
MCV: 100 fL (ref 78.0–100.0)
PLATELETS: 166 10*3/uL (ref 150–400)
RBC: 4.06 MIL/uL (ref 3.87–5.11)
RDW: 12.6 % (ref 11.5–15.5)
WBC: 9.3 10*3/uL (ref 4.0–10.5)

## 2013-08-24 LAB — BASIC METABOLIC PANEL
ANION GAP: 14 (ref 5–15)
BUN: 9 mg/dL (ref 6–23)
CALCIUM: 8.5 mg/dL (ref 8.4–10.5)
CO2: 23 meq/L (ref 19–32)
CREATININE: 0.45 mg/dL — AB (ref 0.50–1.10)
Chloride: 103 mEq/L (ref 96–112)
GFR calc Af Amer: >90 mL/min (ref >90)
GFR calc non Af Amer: >90 mL/min (ref >90)
GLUCOSE: 119 mg/dL — AB (ref 70–99)
Potassium: 3.9 mEq/L (ref 3.7–5.3)
Sodium: 140 mEq/L (ref 137–147)

## 2013-08-24 LAB — POC OCCULT BLOOD, ED: Fecal Occult Bld: POSITIVE — AB

## 2013-08-24 MED ORDER — SODIUM CHLORIDE 0.9 % IV SOLN: 1000.0000 mL | Freq: Once | INTRAVENOUS | Status: DC

## 2013-08-24 MED ORDER — SODIUM CHLORIDE 0.9 % IV SOLN
1000.0000 mL | INTRAVENOUS | Status: DC
  Administered 2013-08-24 – 2013-08-25 (×3): 1000 mL via INTRAVENOUS

## 2013-08-24 NOTE — ED Notes (Signed)
Pt presents with rectal bleeding for "a long time" worse today. Pt also reports constipation- last BM weeks ago. Unable to answer about dark stool. Denies increased weakness or fatigue.

## 2013-08-24 NOTE — ED Provider Notes (Signed)
CSN: [id number]     Arrival date & time 08/24/13  1857 History   First MD Initiated Contact with Patient 08/24/13 2244     Chief Complaint  Patient presents with  . Rectal Bleeding   HPI  History provided by the patient and son. Patient is an 80 year old female with history of hypertension, hyperlipidemia, A. fib who presents with concerns for rectal bleeding. Patient's son states that he noticed a silver dollar size area of blood on the toilet seat today. When he asked patient she reported having some intermittent bleeding for some time. Patient is a poor historian does not specify the length of time. She does also report having some hard stools and pain and discomfort with defecation. Son reports that she is supposed to be taking a stool softener but rarely does. There is no specific history given about previous hemorrhoids or other rectal bleeding complications. Patient does not recall her last colonoscopy. Patient generally has low activity is seated or laying down most of the day. There has been no change in her activity. No change in general energy level. He denies lightheadedness or shortness of breath. No increased weakness. No fever, chills or sweats.   Past Medical History  Diagnosis Date  . Hypertension   . Hyperlipidemia   . Thyroid disease     hypothyroidism   . Chronic constipation   . A-fib     perioperative, no recurrence  . Aortic valve prosthesis present   . History of echocardiogram 2008    intact LVEF, 65% nl AVR fxn, mod bileaflet MV thickening  . Hx of echocardiogram 2015    Echo (04/2013): Focal basal hypertrophy, EF 50-55%, normal wall motion, mild MR, PASP 33 mm Hg, AVR okay (mean gradient 6 mmHg)   History reviewed. No pertinent past surgical history. Family History  Problem Relation Age of Onset  . Heart disease Mother   . Heart disease Father    History  Substance Use Topics  . Smoking status: Never Smoker   . Smokeless tobacco: Not on file  . Alcohol  Use: No   OB History   Grav Para Term Preterm Abortions TAB SAB Ect Mult Living                 Review of Systems  Constitutional: Negative for fever, chills and fatigue.  Gastrointestinal: Positive for constipation and blood in stool. Negative for nausea, vomiting and abdominal pain.  Neurological: Negative for dizziness and light-headedness.  All other systems reviewed and are negative.     Allergies  Bactrim; Celebrex; and Codeine  Home Medications   Prior to Admission medications   Medication Sig Start Date End Date Taking? Authorizing Provider  aspirin 81 MG tablet Take 81 mg by mouth daily.   Yes Historical Provider, MD  atorvastatin (LIPITOR) 40 MG tablet Take 1 tablet (40 mg total) by mouth daily at 6 PM. 04/26/13  Yes Liliane Shi, PA-C  augmented betamethasone dipropionate (DIPROLENE-AF) 0.05 % ointment Apply 1 application topically daily as needed. For eczema   Yes Historical Provider, MD  levothyroxine (SYNTHROID, LEVOTHROID) 75 MCG tablet Take 75 mcg by mouth daily before breakfast. Only 5 days a week--none on sat/sun   Yes Historical Provider, MD  metoprolol (LOPRESSOR) 50 MG tablet Take 50 mg by mouth 2 (two) times daily.   Yes Historical Provider, MD   BP 92/43  Pulse 86  Temp(Src) 98.3 F (36.8 C) (Oral)  Resp 18  SpO2 95% Physical Exam  Nursing  note and vitals reviewed. Constitutional: She is oriented to person, place, and time. She appears well-developed and well-nourished. No distress.  HENT:  Head: Normocephalic.  Cardiovascular: Normal rate.   Pulmonary/Chest: Effort normal and breath sounds normal. No respiratory distress. She has no wheezes.  Abdominal: Soft. There is no tenderness.  Genitourinary:  Chaperone was present. No anal fissures or external hemorrhoids. Stool is a light brown color without gross blood.  Patient does have small amount of bleeding from a sacral ulceration. There is some dried blood of the skin around the buttocks.   Neurological: She is alert and oriented to person, place, and time.  Skin: Skin is warm and dry. No rash noted.  Psychiatric: She has a normal mood and affect. Her behavior is normal.    ED Course  Procedures   COORDINATION OF CARE:  Nursing notes reviewed. Vital signs reviewed. Initial pt interview and examination performed.   Filed Vitals:   08/24/13 1903 08/24/13 2243  BP: 132/62 92/43  Pulse: 65 86  Temp: 98.3 F (36.8 C)   TempSrc: Oral   Resp: 20 18  SpO2: 99% 95%    11:08 PM-patient seen and evaluated. Patient resting appears comfortable no acute distress.  11:20PM pt discussed with Attending Physician.  Bleeding seems to be most likely caused from sacral pressure ulcer given its active bleeding however she is hemoccult positive. Since initial evaluation pt's BP has been trending down and now is in the 70's after repeated checks. Will plan to admit pt.     Results for orders placed during the hospital encounter of 08/24/13  CBC      Result Value Ref Range   WBC 9.3  4.0 - 10.5 K/uL   RBC 4.06  3.87 - 5.11 MIL/uL   Hemoglobin 14.0  12.0 - 15.0 g/dL   HCT 40.6  36.0 - 46.0 %   MCV 100.0  78.0 - 100.0 fL   MCH 34.5 (*) 26.0 - 34.0 pg   MCHC 34.5  30.0 - 36.0 g/dL   RDW 12.6  11.5 - 15.5 %   Platelets 166  150 - 400 K/uL  BASIC METABOLIC PANEL      Result Value Ref Range   Sodium 140  137 - 147 mEq/L   Potassium 3.9  3.7 - 5.3 mEq/L   Chloride 103  96 - 112 mEq/L   CO2 23  19 - 32 mEq/L   Glucose, Bld 119 (*) 70 - 99 mg/dL   BUN 9  6 - 23 mg/dL   Creatinine, Ser 0.45 (*) 0.50 - 1.10 mg/dL   Calcium 8.5  8.4 - 10.5 mg/dL   GFR calc non Af Amer >90  >90 mL/min   GFR calc Af Amer >90  >90 mL/min   Anion gap 14  5 - 15  POC OCCULT BLOOD, ED      Result Value Ref Range   Fecal Occult Bld POSITIVE (*) NEGATIVE       MDM   Final diagnoses:  Gastrointestinal hemorrhage, unspecified gastritis, unspecified gastrointestinal hemorrhage type  Sacral ulcer,  stage II        Martie Lee, PA-C 08/25/13 [phone]

## 2013-08-24 NOTE — Discharge Instructions (Signed)
You were seen and evaluated for your concerns of bleeding. You were found to have an ulcer soar near your tail bone. Keep this area clean and dry. Change position frequently to help relieve pressure to this area. Follow up with your primary doctor for continued evaluation and treatment.   Pressure Ulcer A pressure ulcer is a sore that has formed from the breakdown of skin and exposure of deeper layers of tissue. It develops in areas of the body where there is unrelieved pressure. Pressure ulcers are usually found over a bony area, such as the shoulder blades, spine, lower back, hips, knees, ankles, and heels. Pressure ulcers vary in severity. Your health care provider may determine the severity (stage) of your pressure ulcer. The stages include:  Stage I--The skin is red, and when the skin is pressed, it stays red.  Stage II--The top layer of skin is gone, and there is a shallow, pink ulcer.  Stage III--The ulcer becomes deeper, and it is more difficult to see the whole wound. Also, there may be yellow or brown parts, as well as pink and red parts.  Stage IV--The ulcer may be deep and red, pink, brown, white, or yellow. Bone or muscle may be seen.  Unstageable pressure ulcer--The ulcer is covered almost completely with black, brown, or yellow tissue. It is not known how deep the ulcer is or what stage it is until this covering comes off.  Suspected deep tissue injury--A person's skin can be injured from pressure or pulling on the skin when his or her position is changed. The skin appears purple or maroon. There may not be an opening in the skin, but there could be a blood-filled blister. This deep tissue injury is often difficult to see in people with darker skin tones. The site may open and become deeper in time. However, early interventions will help the area heal and may prevent the area from opening. CAUSES  Pressure ulcers are caused by pressure against the skin that limits the flow of blood  to the skin and nearby tissues. There are many risk factors that can lead to pressure sores. RISK FACTORS  Decreased ability to move.  Decreased ability to feel pain or discomfort.  Excessive skin moisture from urine, stool, sweat, or secretions.  Poor nutrition.  Dehydration.  Tobacco, drug, or alcohol abuse.  Having someone pull on bedsheets that are under you, such as when health care workers are changing your position in a hospital bed.  Obesity.  Increased adult age.  Hospitalization in a critical care unit for longer than 4 days with use of medical devices.  Prolonged use of medical devices.  Critical illness.  Anemia.  Traumatic brain injury.  Spinal cord injury.  Stroke.  Diabetes.  Poor blood glucose control.  Low blood pressure (hypotension).  Low oxygen levels.  Medicines that reduce blood flow.  Infection. DIAGNOSIS  Your health care provider will diagnose your pressure ulcer based on its appearance. The health care provider may determine the stage of your pressure ulcer as well. Tests may be done to check for infection, to assess your circulation, or to check for other diseases, such as diabetes. TREATMENT  Treatment of your pressure ulcer begins with determining what stage the ulcer is in. Your treatment team may include your health care provider, a wound care specialist, a nutritionist, a physical therapist, and a Psychologist, sport and exercise. Possible treatments may include:   Moving or repositioning every 1-2 hours.  Using beds or mattresses to shift your  body weight and pressure points frequently.  Improving your diet.  Cleaning and bandaging (dressing) the open wound.  Giving antibiotic medicines.  Removing damaged tissue.  Surgery and sometimes skin grafts. HOME CARE INSTRUCTIONS  If you were hospitalized, follow the care plan that was started in the hospital.  Avoid staying in the same position for more than 2 hours. Use padding, devices, or  mattresses to cushion your pressure points as directed by your health care provider.  Eat a well-balanced diet. Take nutritional supplements and vitamins as directed by your health care provider.  Keep all follow-up appointments.  Only take over-the-counter or prescription medicines for pain, fever, or discomfort as directed by your health care provider. SEEK MEDICAL CARE IF:   Your pressure ulcer is not improving.  You do not know how to care for your pressure ulcer.  You notice other areas of redness on your skin.  You have a fever. SEEK IMMEDIATE MEDICAL CARE IF:   You have increasing redness, swelling, or pain in your pressure ulcer.  You notice pus coming from your pressure ulcer.  You notice a bad smell coming from the wound or dressing.  Your pressure ulcer opens up again. Document Released: 12/30/2004 Document Revised: 01/04/2013 Document Reviewed: 09/06/2012 [facility] Patient Information 2015 [place], [place]. This information is not intended to replace advice given to you by your health care provider. Make sure you discuss any questions you have with your health care provider.

## 2013-08-24 NOTE — ED Provider Notes (Signed)
80 year old female was brought in by family who noted some blood in her stool today. She is mostly bedridden and has not been eating well and is generally malnourished. On exam, she is somewhat cachectic. Lungs are clear and heart is regular rate and rhythm. She is noted to have a sacral decubitus ulcer. Stool was Hemoccult positive but hemoglobin is actually above baseline. However, blood pressure has trended down sharply since arrival and she is now running blood pressure around 80 systolic. She'll be given IV fluids and will need to be admitted. Lactic acid level be checked as will ECG, troponin, chest x-ray and urinalysis. I reviewed her past records and she is followed by cardiology because she is status post aortic valve replacement. I see no mention of CODE STATUS and son who is with her and states that any mention of CODE STATUS would be on her record.   Case is discussed with Dr. Arnoldo Morale of [facility] who agrees to admit the patient.  Medical screening examination/treatment/procedure(s) were conducted as a shared visit with non-physician practitioner(s) and myself.  I personally evaluated the patient during the encounter.   EKG Interpretation   Date/Time:  Wednesday August 24 2013 23:41:08 EDT Ventricular Rate:  75 PR Interval:  154 QRS Duration: 145 QT Interval:  447 QTC Calculation: 499 R Axis:   -42 Text Interpretation:  Sinus rhythm Left bundle branch block When compared  with ECG of 04/30/2007, No significant change was found Confirmed by [facility]   MD, Kielyn Kardell ([id number]) on 08/24/2013 11:50:33 PM     CRITICAL CARE Performed by: QQPYP,PJKDT Total critical care time: 40 minutes Critical care time was exclusive of separately billable procedures and treating other patients. Critical care was necessary to treat or prevent imminent or life-threatening deterioration. Critical care was time spent personally by me on the following activities: development of treatment plan with patient  and/or surrogate as well as nursing, discussions with consultants, evaluation of patient's response to treatment, examination of patient, obtaining history from patient or surrogate, ordering and performing treatments and interventions, ordering and review of laboratory studies, ordering and review of radiographic studies, pulse oximetry and re-evaluation of patient's condition.     Delora Fuel, MD 26/71/24 [id number]

## 2013-08-25 DIAGNOSIS — K922 Gastrointestinal hemorrhage, unspecified: Secondary | ICD-10-CM

## 2013-08-25 DIAGNOSIS — I959 Hypotension, unspecified: Secondary | ICD-10-CM

## 2013-08-25 DIAGNOSIS — I9589 Other hypotension: Secondary | ICD-10-CM

## 2013-08-25 DIAGNOSIS — K625 Hemorrhage of anus and rectum: Secondary | ICD-10-CM

## 2013-08-25 DIAGNOSIS — L89151 Pressure ulcer of sacral region, stage 1: Secondary | ICD-10-CM | POA: Diagnosis present

## 2013-08-25 DIAGNOSIS — L8991 Pressure ulcer of unspecified site, stage 1: Secondary | ICD-10-CM

## 2013-08-25 DIAGNOSIS — I359 Nonrheumatic aortic valve disorder, unspecified: Secondary | ICD-10-CM

## 2013-08-25 DIAGNOSIS — L89109 Pressure ulcer of unspecified part of back, unspecified stage: Secondary | ICD-10-CM

## 2013-08-25 DIAGNOSIS — E039 Hypothyroidism, unspecified: Secondary | ICD-10-CM | POA: Diagnosis present

## 2013-08-25 LAB — CBC
HCT: 33.2 % — ABNORMAL LOW (ref 36.0–46.0)
HEMATOCRIT: 35.1 % — AB (ref 36.0–46.0)
Hemoglobin: 11.7 g/dL — ABNORMAL LOW (ref 12.0–15.0)
Hemoglobin: 11.2 g/dL — ABNORMAL LOW (ref 12.0–15.0)
MCH: 34.1 pg — AB (ref 26.0–34.0)
MCH: 34.5 pg — AB (ref 26.0–34.0)
MCHC: 33.3 g/dL (ref 30.0–36.0)
MCHC: 33.7 g/dL (ref 30.0–36.0)
MCV: 102.3 fL — AB (ref 78.0–100.0)
MCV: 102.2 fL — AB (ref 78.0–100.0)
Platelets: 125 10*3/uL — ABNORMAL LOW (ref 150–400)
Platelets: 110 10*3/uL — ABNORMAL LOW (ref 150–400)
RBC: 3.43 MIL/uL — AB (ref 3.87–5.11)
RBC: 3.25 MIL/uL — AB (ref 3.87–5.11)
RDW: 12.8 % (ref 11.5–15.5)
RDW: 12.8 % (ref 11.5–15.5)
WBC: 9.3 10*3/uL (ref 4.0–10.5)
WBC: 6.5 10*3/uL (ref 4.0–10.5)

## 2013-08-25 LAB — URINALYSIS, ROUTINE W REFLEX MICROSCOPIC
Bilirubin Urine: NEGATIVE
GLUCOSE, UA: NEGATIVE mg/dL
KETONES UR: NEGATIVE mg/dL
NITRITE: NEGATIVE
Protein, ur: NEGATIVE mg/dL
Specific Gravity, Urine: 1.007 (ref 1.005–1.030)
Urobilinogen, UA: 1 mg/dL (ref 0.0–1.0)
pH: 7 (ref 5.0–8.0)

## 2013-08-25 LAB — BASIC METABOLIC PANEL
Anion gap: 10 (ref 5–15)
BUN: 10 mg/dL (ref 6–23)
CALCIUM: 7.6 mg/dL — AB (ref 8.4–10.5)
CO2: 22 meq/L (ref 19–32)
Chloride: 111 mEq/L (ref 96–112)
Creatinine, Ser: 0.48 mg/dL — ABNORMAL LOW (ref 0.50–1.10)
GFR calc Af Amer: >90 mL/min (ref >90)
GFR calc non Af Amer: 89 mL/min — ABNORMAL LOW (ref >90)
Glucose, Bld: 101 mg/dL — ABNORMAL HIGH (ref 70–99)
Potassium: 4.2 mEq/L (ref 3.7–5.3)
SODIUM: 143 meq/L (ref 137–147)

## 2013-08-25 LAB — TYPE AND SCREEN
ABO/RH(D): O POS
Antibody Screen: NEGATIVE

## 2013-08-25 LAB — ABO/RH: ABO/RH(D): O POS

## 2013-08-25 LAB — TSH: TSH: 0.357 u[IU]/mL (ref 0.350–4.500)

## 2013-08-25 LAB — URINE MICROSCOPIC-ADD ON

## 2013-08-25 LAB — CORTISOL: Cortisol, Plasma: 17.9 ug/dL

## 2013-08-25 LAB — I-STAT CG4 LACTIC ACID, ED: LACTIC ACID, VENOUS: 1.2 mmol/L (ref 0.5–2.2)

## 2013-08-25 LAB — TROPONIN I: Troponin I: <0.3 ng/mL (ref <0.30)

## 2013-08-25 LAB — MRSA PCR SCREENING: MRSA by PCR: NEGATIVE

## 2013-08-25 MED ORDER — ACETAMINOPHEN 650 MG RE SUPP: 650.0000 mg | Freq: Four times a day (QID) | RECTAL | Status: DC | PRN

## 2013-08-25 MED ORDER — OXYCODONE HCL 5 MG PO TABS: 5.0000 mg | ORAL_TABLET | ORAL | Status: DC | PRN

## 2013-08-25 MED ORDER — SODIUM CHLORIDE 0.9 % IV SOLN: INTRAVENOUS | Status: DC

## 2013-08-25 MED ORDER — PANTOPRAZOLE SODIUM 40 MG IV SOLR
40.0000 mg | Freq: Two times a day (BID) | INTRAVENOUS | Status: DC
  Administered 2013-08-25 – 2013-08-26 (×5): 40 mg via INTRAVENOUS
  Filled 2013-08-25 (×8): qty 40

## 2013-08-25 MED ORDER — ONDANSETRON HCL 4 MG/2ML IJ SOLN: 4.0000 mg | Freq: Four times a day (QID) | INTRAMUSCULAR | Status: DC | PRN

## 2013-08-25 MED ORDER — SODIUM CHLORIDE 0.9 % IV SOLN
INTRAVENOUS | Status: DC
  Administered 2013-08-25: 125 mL via INTRAVENOUS

## 2013-08-25 MED ORDER — HYDROMORPHONE HCL PF 1 MG/ML IJ SOLN: 0.5000 mg | INTRAMUSCULAR | Status: DC | PRN

## 2013-08-25 MED ORDER — SODIUM CHLORIDE 0.9 % IV BOLUS (SEPSIS)
250.0000 mL | Freq: Once | INTRAVENOUS | Status: AC
  Administered 2013-08-25: 250 mL via INTRAVENOUS

## 2013-08-25 MED ORDER — LEVOTHYROXINE SODIUM 75 MCG PO TABS
75.0000 ug | ORAL_TABLET | ORAL | Status: DC
  Administered 2013-08-25 – 2013-08-26 (×2): 75 ug via ORAL
  Filled 2013-08-25 (×3): qty 1

## 2013-08-25 MED ORDER — PEG 3350-KCL-NA BICARB-NACL 420 G PO SOLR
4000.0000 mL | Freq: Once | ORAL | Status: AC
  Administered 2013-08-25: 4000 mL via ORAL
  Filled 2013-08-25: qty 4000

## 2013-08-25 MED ORDER — SODIUM CHLORIDE 0.9 % IV BOLUS (SEPSIS): 250.0000 mL | Freq: Once | INTRAVENOUS | Status: DC

## 2013-08-25 MED ORDER — ATORVASTATIN CALCIUM 40 MG PO TABS
40.0000 mg | ORAL_TABLET | Freq: Every day | ORAL | Status: DC
  Administered 2013-08-25 – 2013-08-26 (×2): 40 mg via ORAL
  Filled 2013-08-25 (×3): qty 1

## 2013-08-25 MED ORDER — SODIUM CHLORIDE 0.9 % IV SOLN
INTRAVENOUS | Status: DC
Start: 2013-08-25 — End: 2013-08-26
  Administered 2013-08-25 – 2013-08-26 (×2): via INTRAVENOUS

## 2013-08-25 MED ORDER — ALUM & MAG HYDROXIDE-SIMETH 200-200-20 MG/5ML PO SUSP
30.0000 mL | Freq: Four times a day (QID) | ORAL | Status: DC | PRN
Start: 2013-08-25 — End: 2013-08-27

## 2013-08-25 MED ORDER — BOOST / RESOURCE BREEZE PO LIQD
1.0000 | Freq: Every day | ORAL | Status: DC
  Administered 2013-08-26: 1 via ORAL

## 2013-08-25 MED ORDER — ONDANSETRON HCL 4 MG PO TABS: 4.0000 mg | ORAL_TABLET | Freq: Four times a day (QID) | ORAL | Status: DC | PRN

## 2013-08-25 MED ORDER — ACETAMINOPHEN 325 MG PO TABS: 650.0000 mg | ORAL_TABLET | Freq: Four times a day (QID) | ORAL | Status: DC | PRN

## 2013-08-25 NOTE — Plan of Care (Signed)
Problem: Phase I Progression Outcomes Goal: Initial discharge plan identified Outcome: Completed/Met Date Met:  08/25/13 Home with son.

## 2013-08-25 NOTE — Progress Notes (Signed)
INITIAL NUTRITION ASSESSMENT  DOCUMENTATION CODES Per approved criteria  -Non-severe (moderate) malnutrition in the context of chronic illness   INTERVENTION: Resource Breeze po daily, each supplement provides 250 kcal and 9 grams of protein RD to follow for nutrition care plan  NUTRITION DIAGNOSIS: Inadequate oral intake related to poor appetite as evidenced by patient report  Goal: Pt to meet >/= 90% of their estimated nutrition needs   Monitor:  PO & supplemental intake, weight, labs, I/O's  Reason for Assessment: Malnutrition Screening Tool Report  80 y.o. female  Admitting Dx: Rectal bleeding  ASSESSMENT: 80 y.o. Female with a history of HTN, hyperlipidemia, and S/P Porcine AVR who was brought to the ED after her son noticed that she had passed dark red blood from her rectum. In the ED, her initial hemoglobin level was 14.0, however she began to ave hypotension and was administered IVFs, and referred for admission.  RD spoke with patient and patient's son at bedside; report patient has had a poor appetite for "a long time;" pt does not consume meals but rather snacks throughout the day: crackers, yogurt, cereal and granola bars; son reports no recent weight loss; pt amenable to trying Resource Breeze supplements; RD to order.  Nutrition Focused Physical Exam:  Subcutaneous Fat:  Orbital Region: N/A Upper Arm Region: moderate depletion Thoracic and Lumbar Region: N/A  Muscle:  Temple Region: moderate depletion Clavicle Bone Region: moderate depletion Clavicle and Acromion Bone Region: moderate depletion Scapular Bone Region: N/A Dorsal Hand: moderate depletion Patellar Region: N/A Anterior Thigh Region: N/A Posterior Calf Region: N/A  Edema: none  Patient meets criteria for moderate malnutrition in the context of chronic illness as evidenced by < 75% intake of estimated energy requirement for > 1 month, moderate muscle & subcutaneous fat loss.  Height: Ht  Readings from Last 1 Encounters:  04/26/13 5\' 4"  (1.626 m)    Weight: Wt Readings from Last 1 Encounters:  04/26/13 114 lb (51.71 kg)    Ideal Body Weight: 120 lb  % Ideal Body Weight: 95%  Wt Readings from Last 10 Encounters:  04/26/13 114 lb (51.71 kg)    Usual Body Weight: unknown  % Usual Body Weight: ---  BMI:  19.8 kg/m2  Estimated Nutritional Needs: Kcal: 1200-1400 Protein: 60-70 gm Fluid: >/= 1.5 L  Skin: Intact  Diet Order: Clear Liquid  EDUCATION NEEDS: -No education needs identified at this time   Intake/Output Summary (Last 24 hours) at 08/25/13 1441 Last data filed at 08/25/13 0800  Gross per 24 hour  Intake 2343.75 ml  Output    100 ml  Net 2243.75 ml   Labs:   Recent Labs Lab 08/24/13 1917 08/25/13 0250  NA 140 143  K 3.9 4.2  CL 103 111  CO2 23 22  BUN 9 10  CREATININE 0.45* 0.48*  CALCIUM 8.5 7.6*  GLUCOSE 119* 101*   Scheduled Meds: . atorvastatin  40 mg Oral q1800  . levothyroxine  75 mcg Oral Once per day on Mon Tue Wed Thu Fri  . pantoprazole (PROTONIX) IV  40 mg Intravenous Q12H  . sodium chloride  250 mL Intravenous Once    Continuous Infusions: . sodium chloride 75 mL/hr (08/25/13 0750)    Past Medical History  Diagnosis Date  . Hypertension   . Hyperlipidemia   . Thyroid disease     hypothyroidism   . Chronic constipation   . A-fib     perioperative, no recurrence  . Aortic valve prosthesis present   .  History of echocardiogram 2008    intact LVEF, 65% nl AVR fxn, mod bileaflet MV thickening  . Hx of echocardiogram 2015    Echo (04/2013): Focal basal hypertrophy, EF 50-55%, normal wall motion, mild MR, PASP 33 mm Hg, AVR okay (mean gradient 6 mmHg)    History reviewed. No pertinent past surgical history.  Arthur Holms, RD, LDN Pager #: [phone] After-Hours Pager #: [phone]

## 2013-08-25 NOTE — Progress Notes (Signed)
Utilization review completed. Zackry Deines, RN, BSN. 

## 2013-08-25 NOTE — Progress Notes (Signed)
Spoke with Lamar Blinks NP regarding BP 91/37 currently, trending down since IVF rate decreased from 125cc/hr to 75cc/hr per Dr. Arnoldo Morale admission orders. Notified of Hgb on recent draw down to 11.7 from 14.0. Will increase fluids back up to 125cc/hr per order. Richarda Blade RN

## 2013-08-25 NOTE — Consult Note (Addendum)
WOC wound consult note Reason for Consult: Consult requested for buttocks/sacrum Wound type: Erythremia to gluteal fold surrounding fissure. Pressure Ulcer POA: Yes; This is NOT a pressure ulcer but was present on admission. Measurement: Stage 1 area of non-blanchable erythremia 5X3cm Wound bed: Inner wound has a 3X.2X.1cm partial thickness fissure; appearance of both sites is consistent with moisture associated skin damage.  Patient is currently and frequently incontinent of  stool. Drainage (amount, consistency, odor) No odor, small amt pink drainage from fissure area. Dressing procedure/placement/frequency: Avoid use dressings which can trap stool underneath.  Barrier cream to protect skin and repel moisture. Please re-consult if further assistance is needed.  Thank-you,  Julien Girt MSN, [place], [place], [place], [place]

## 2013-08-25 NOTE — H&P (Signed)
[facility] Admission History and Physical       [name] HYQ:[id number] DOB: 08/19/1929 DOA: 08/24/2013  Referring physician:  PCP: Abigail Miyamoto, MD  Specialists:   Chief Complaint:   Rectal Bleeding  HPI: [name] is a 80 y.o. female with a history of HTN. Hyperlipidemia, and S/P Porcine AVR who was brought to the ED after her son noticed that she had passed dark red blood from her rectum.   She did not have complaints of ABD pain or nausea or vomiting.   She has weakness but denies SOB, fatigue and Chest pain.   She has not had any similar symptoms in the past per her son.   In the ED,  her initial hemoglobin level was 14.0, however she began to ave hypotension and was administered IVFs, and referred for admission to a Stepdown bed.      Review of Systems:  Constitutional: No Weight Loss, No Weight Gain, Night Sweats, Fevers, Chills, Dizziness, Fatigue, or +Generalized Weakness HEENT: No Headaches, Difficulty Swallowing,Tooth/Dental Problems,Sore Throat,  No Sneezing, Rhinitis, Ear Ache, Nasal Congestion, or Post Nasal Drip,  Cardio-vascular:  No Chest pain, Orthopnea, PND, Edema in Lower Extremities, Anasarca, Dizziness, Palpitations  Resp: No Dyspnea, No DOE, No Productive Cough, No Non-Productive Cough, No Hemoptysis, No Wheezing.    GI: No Heartburn, Indigestion, Abdominal Pain, Nausea, Vomiting, Diarrhea, Hematemesis, +Hematochezia, Melena, Change in Bowel Habits,  Loss of Appetite  GU: No Dysuria, Change in Color of Urine, No Urgency or Frequency, No Flank pain.  Musculoskeletal: No Joint Pain or Swelling, No Decreased Range of Motion, No Back Pain.  Neurologic: No Syncope, No Seizures, Muscle Weakness, Paresthesia, Vision Disturbance or Loss, No Diplopia, No Vertigo, No Difficulty Walking,  Skin: No Rash or Lesions. Psych: No Change in Mood or Affect, No Depression or Anxiety, No Memory loss, No Confusion, or Hallucinations   Past Medical History    Diagnosis Date  . Hypertension   . Hyperlipidemia   . Thyroid disease     hypothyroidism   . Chronic constipation   . A-fib     perioperative, no recurrence  . Aortic valve prosthesis present   . History of echocardiogram 2008    intact LVEF, 65% nl AVR fxn, mod bileaflet MV thickening  . Hx of echocardiogram 2015    Echo (04/2013): Focal basal hypertrophy, EF 50-55%, normal wall motion, mild MR, PASP 33 mm Hg, AVR okay (mean gradient 6 mmHg)     History reviewed. No pertinent past surgical history.    Prior to Admission medications   Medication Sig Start Date End Date Taking? Authorizing Provider  aspirin 81 MG tablet Take 81 mg by mouth daily.   Yes Historical Provider, MD  atorvastatin (LIPITOR) 40 MG tablet Take 1 tablet (40 mg total) by mouth daily at 6 PM. 04/26/13  Yes Liliane Shi, PA-C  augmented betamethasone dipropionate (DIPROLENE-AF) 0.05 % ointment Apply 1 application topically daily as needed. For eczema   Yes Historical Provider, MD  levothyroxine (SYNTHROID, LEVOTHROID) 75 MCG tablet Take 75 mcg by mouth daily before breakfast. Only 5 days a week--none on sat/sun   Yes Historical Provider, MD  metoprolol (LOPRESSOR) 50 MG tablet Take 50 mg by mouth 2 (two) times daily.   Yes Historical Provider, MD      Allergies  Allergen Reactions  . Bactrim [Sulfamethoxazole-Tmp Ds]     unknown  . Celebrex [Celecoxib]     unknown  . Codeine  Make me sick      Social History:  Lives a Bed to Chair Existence, Lives with her Son who is her primary care-taker  reports that she has never smoked. She does not have any smokeless tobacco history on file. She reports that she does not drink alcohol or use illicit drugs.     Family History  Problem Relation Age of Onset  . Heart disease Mother   . Heart disease Father        Physical Exam:  GEN:  Pleasant Thin Cachectic Elderly 80 y.o. Caucasian female examined and in no acute distress; cooperative with exam Filed  Vitals:   08/24/13 2330 08/25/13 0000 08/25/13 0030 08/25/13 0045  BP: 80/40 92/43 98/42  104/45  Pulse: 77 73 66 71  Temp:      TempSrc:      Resp: 17 17 16 14   SpO2: 94% 98% 97% 100%   Blood pressure 104/45, pulse 71, temperature 98.3 F (36.8 C), temperature source Oral, resp. rate 14, SpO2 100.00%. PSYCH: She is alert and oriented x4; does not appear anxious does not appear depressed; affect is normal HEENT: Normocephalic and Atraumatic, Mucous membranes pink; PERRLA; EOM intact; Fundi:  Benign;  No scleral icterus, Nares: Patent, Oropharynx: Clear,    Neck:  FROM, No Cervical Lymphadenopathy nor Thyromegaly or Carotid Bruit; No JVD; Breasts:: Not examined CHEST WALL: No tenderness CHEST: Normal respiration, clear to auscultation bilaterally HEART: Regular rate and rhythm; no murmurs rubs or gallops BACK: No kyphosis or scoliosis; No CVA tenderness ABDOMEN: Positive Bowel Sounds, Scaphoid, Soft Non-Tender; No Masses, No Organomegaly.   Rectal Exam: done by EDP FOBT: Heme+,  Stage I Pressure Area surrounding Sacrum and perineum EXTREMITIES: No Cyanosis, Clubbing, or Edema; No Ulcerations. Genitalia: not examined PULSES: 2+ and symmetric SKIN: Normal hydration no rash,  +Stage I Pressure Ulcer on Sacrum  CNS:  Alert and Oriented x 4, No focal Deficits Generalized Weakness  Vascular: pulses palpable throughout    Labs on Admission:  Basic Metabolic Panel:  Recent Labs Lab 08/24/13 1917  NA 140  K 3.9  CL 103  CO2 23  GLUCOSE 119*  BUN 9  CREATININE 0.45*  CALCIUM 8.5   Liver Function Tests: No results found for this basename: AST, ALT, ALKPHOS, BILITOT, PROT, ALBUMIN,  in the last 168 hours No results found for this basename: LIPASE, AMYLASE,  in the last 168 hours No results found for this basename: AMMONIA,  in the last 168 hours CBC:  Recent Labs Lab 08/24/13 1917  WBC 9.3  HGB 14.0  HCT 40.6  MCV 100.0  PLT 166   Cardiac Enzymes:  Recent Labs Lab  08/25/13 0025  TROPONINI <0.30    BNP (last 3 results) No results found for this basename: PROBNP,  in the last 8760 hours CBG: No results found for this basename: GLUCAP,  in the last 168 hours  Radiological Exams on Admission: Dg Chest Port 1 View  08/25/2013   CLINICAL DATA:  Hypotension.  Weakness.  EXAM: PORTABLE CHEST - 1 VIEW  COMPARISON:  Chest x-ray 06/23/2013.  FINDINGS: Lung volumes are normal. No consolidative airspace disease. No pleural effusions. No pneumothorax. No pulmonary nodule or mass noted. Pulmonary vasculature and the cardiomediastinal silhouette are within normal limits. Atherosclerosis in the thoracic aorta. Status post median sternotomy. Surgical clips project over the right upper quadrant of the abdomen.  IMPRESSION: 1.  No radiographic evidence of acute cardiopulmonary disease. 2. Atherosclerosis. 3. Postoperative changes, as above.  Electronically Signed   By: Vinnie Langton M.D.   On: 08/25/2013 00:19      Assessment/Plan:   80 y.o. female with  Principal Problem:   1.   Rectal bleeding   Monitor H/Hs,   Transfuse if needed, type and Screen Sent   IV Protonix, though most likely Lower GI source   Consult GI in AM   Active Problems:   2.   Hypotension   IVFs for Fluid Resuscitation   Hold Anti-Hypertensives   Check Cortisol Level     3.   Aortic valve disorders   Hx of a Porcine Valve Replacement   Does not requires Anti-coagulation Rx        4.   Other and unspecified hyperlipidemia   Continue Atorvastatin Rx     5.   Stage I pressure ulcer of sacral region   Wound Care Evaluation     6.   Hypothyroid   Continue Levothyroxine RX   Check TSH.       7.   DVT prophylaxis   SCDs      Code Status:   FULL CODE   Family Communication:   Son at Bedside  Disposition Plan:   Inpatient    Time spent:  [facility] Pager [phone]   If [place] Please Contact the Day Rounding Team MD for  [facility]  If 7PM-7AM, Please Contact Night-Floor Coverage  [email or web address] Password [email or web address] 08/25/2013, 1:15 AM

## 2013-08-25 NOTE — Consult Note (Signed)
Unassigned Consult  Reason for Consult: Hematochezia Referring Physician: [facility]  Ronalda M Barradas HPI: This is an 80 year old female with a PMH of HTN, hyperlipidemia, s/p porcine AVR, and diverticula on CT scan admitted to the hospital for hematochezia.  She was noticed to have maroon colored stools and the bleeding was painless.  In the distant past she had a colonoscopy, but she cannot recall the findings.  Her HGB did drop down from 14 to 11 and it was associated with hypotension.  Her son is the one who brought her to the hospital and she has not reported any of her symptoms to her son.  She is a poor historian and she states that this bleeding has been ongoing "for a while".  Past Medical History  Diagnosis Date  . Hypertension   . Hyperlipidemia   . Thyroid disease     hypothyroidism   . Chronic constipation   . A-fib     perioperative, no recurrence  . Aortic valve prosthesis present   . History of echocardiogram 2008    intact LVEF, 65% nl AVR fxn, mod bileaflet MV thickening  . Hx of echocardiogram 2015    Echo (04/2013): Focal basal hypertrophy, EF 50-55%, normal wall motion, mild MR, PASP 33 mm Hg, AVR okay (mean gradient 6 mmHg)    History reviewed. No pertinent past surgical history.  Family History  Problem Relation Age of Onset  . Heart disease Mother   . Heart disease Father     Social History:  reports that she has never smoked. She does not have any smokeless tobacco history on file. She reports that she does not drink alcohol or use illicit drugs.  Allergies:  Allergies  Allergen Reactions  . Bactrim [Sulfamethoxazole-Tmp Ds]     unknown  . Celebrex [Celecoxib]     unknown  . Codeine     Make me sick     Medications:  Scheduled: . atorvastatin  40 mg Oral q1800  . levothyroxine  75 mcg Oral Once per day on Mon Tue Wed Thu Fri  . pantoprazole (PROTONIX) IV  40 mg Intravenous Q12H  . sodium chloride  250 mL Intravenous Once    Continuous: . sodium chloride 75 mL/hr (08/25/13 0750)    Results for orders placed during the hospital encounter of 08/24/13 (from the past 24 hour(s))  CBC     Status: Abnormal   Collection Time    08/24/13  7:17 PM      Result Value Ref Range   WBC 9.3  4.0 - 10.5 K/uL   RBC 4.06  3.87 - 5.11 MIL/uL   Hemoglobin 14.0  12.0 - 15.0 g/dL   HCT 40.6  36.0 - 46.0 %   MCV 100.0  78.0 - 100.0 fL   MCH 34.5 (*) 26.0 - 34.0 pg   MCHC 34.5  30.0 - 36.0 g/dL   RDW 12.6  11.5 - 15.5 %   Platelets 166  150 - 400 K/uL  BASIC METABOLIC PANEL     Status: Abnormal   Collection Time    08/24/13  7:17 PM      Result Value Ref Range   Sodium 140  137 - 147 mEq/L   Potassium 3.9  3.7 - 5.3 mEq/L   Chloride 103  96 - 112 mEq/L   CO2 23  19 - 32 mEq/L   Glucose, Bld 119 (*) 70 - 99 mg/dL   BUN 9  6 - 23 mg/dL  Creatinine, Ser 0.45 (*) 0.50 - 1.10 mg/dL   Calcium 8.5  8.4 - 10.5 mg/dL   GFR calc non Af Amer >90  >90 mL/min   GFR calc Af Amer >90  >90 mL/min   Anion gap 14  5 - 15  POC OCCULT BLOOD, ED     Status: Abnormal   Collection Time    08/24/13 11:05 PM      Result Value Ref Range   Fecal Occult Bld POSITIVE (*) NEGATIVE  TYPE AND SCREEN     Status: None   Collection Time    08/25/13 12:25 AM      Result Value Ref Range   ABO/RH(D) O POS     Antibody Screen NEG     Sample Expiration 08/28/2013    TROPONIN I     Status: None   Collection Time    08/25/13 12:25 AM      Result Value Ref Range   Troponin I <0.30  <0.30 ng/mL  ABO/RH     Status: None   Collection Time    08/25/13 12:25 AM      Result Value Ref Range   ABO/RH(D) O POS    I-STAT CG4 LACTIC ACID, ED     Status: None   Collection Time    08/25/13 12:33 AM      Result Value Ref Range   Lactic Acid, Venous 1.20  0.5 - 2.2 mmol/L  URINALYSIS, ROUTINE W REFLEX MICROSCOPIC     Status: Abnormal   Collection Time    08/25/13  1:07 AM      Result Value Ref Range   Color, Urine YELLOW  YELLOW   APPearance CLOUDY  (*) CLEAR   Specific Gravity, Urine 1.007  1.005 - 1.030   pH 7.0  5.0 - 8.0   Glucose, UA NEGATIVE  NEGATIVE mg/dL   Hgb urine dipstick SMALL (*) NEGATIVE   Bilirubin Urine NEGATIVE  NEGATIVE   Ketones, ur NEGATIVE  NEGATIVE mg/dL   Protein, ur NEGATIVE  NEGATIVE mg/dL   Urobilinogen, UA 1.0  0.0 - 1.0 mg/dL   Nitrite NEGATIVE  NEGATIVE   Leukocytes, UA SMALL (*) NEGATIVE  URINE MICROSCOPIC-ADD ON     Status: Abnormal   Collection Time    08/25/13  1:07 AM      Result Value Ref Range   Squamous Epithelial / LPF RARE  RARE   WBC, UA 11-20  <3 WBC/hpf   RBC / HPF 3-6  <3 RBC/hpf   Bacteria, UA RARE  RARE   Casts HYALINE CASTS (*) NEGATIVE   Urine-Other MUCOUS PRESENT    MRSA PCR SCREENING     Status: None   Collection Time    08/25/13  1:56 AM      Result Value Ref Range   MRSA by PCR NEGATIVE  NEGATIVE  BASIC METABOLIC PANEL     Status: Abnormal   Collection Time    08/25/13  2:50 AM      Result Value Ref Range   Sodium 143  137 - 147 mEq/L   Potassium 4.2  3.7 - 5.3 mEq/L   Chloride 111  96 - 112 mEq/L   CO2 22  19 - 32 mEq/L   Glucose, Bld 101 (*) 70 - 99 mg/dL   BUN 10  6 - 23 mg/dL   Creatinine, Ser 0.48 (*) 0.50 - 1.10 mg/dL   Calcium 7.6 (*) 8.4 - 10.5 mg/dL   GFR calc non Af Amer 89 (*) >  90 mL/min   GFR calc Af Amer >90  >90 mL/min   Anion gap 10  5 - 15  CBC     Status: Abnormal   Collection Time    08/25/13  2:50 AM      Result Value Ref Range   WBC 9.3  4.0 - 10.5 K/uL   RBC 3.43 (*) 3.87 - 5.11 MIL/uL   Hemoglobin 11.7 (*) 12.0 - 15.0 g/dL   HCT 35.1 (*) 36.0 - 46.0 %   MCV 102.3 (*) 78.0 - 100.0 fL   MCH 34.1 (*) 26.0 - 34.0 pg   MCHC 33.3  30.0 - 36.0 g/dL   RDW 12.8  11.5 - 15.5 %   Platelets 125 (*) 150 - 400 K/uL     Dg Chest Port 1 View  08/25/2013   CLINICAL DATA:  Hypotension.  Weakness.  EXAM: PORTABLE CHEST - 1 VIEW  COMPARISON:  Chest x-ray 06/23/2013.  FINDINGS: Lung volumes are normal. No consolidative airspace disease. No pleural  effusions. No pneumothorax. No pulmonary nodule or mass noted. Pulmonary vasculature and the cardiomediastinal silhouette are within normal limits. Atherosclerosis in the thoracic aorta. Status post median sternotomy. Surgical clips project over the right upper quadrant of the abdomen.  IMPRESSION: 1.  No radiographic evidence of acute cardiopulmonary disease. 2. Atherosclerosis. 3. Postoperative changes, as above.   Electronically Signed   By: Vinnie Langton M.D.   On: 08/25/2013 00:19    ROS:  As stated above in the HPI otherwise negative.  Blood pressure 102/57, pulse 77, temperature 97.8 F (36.6 C), temperature source Oral, resp. rate 17, SpO2 97.00%.    PE: Gen: NAD, Alert and Oriented HEENT:  [place]/AT, EOMI Neck: Supple, no LAD Lungs: CTA Bilaterally CV: RRR without M/G/R ABM: Soft, NTND, +BS Ext: No C/C/E  Assessment/Plan: 1) Diverticular bleed. 2) S/p Porcine AVR. 3) HTN.   The patient is agreeable to undergoing the colonoscopy.  I suspect that she has a diverticular bleed, but she needs a follow up colonoscopy.  Plan: 1) Colonoscopy tomorrow. Josean Lycan D 08/25/2013, 9:23 AM

## 2013-08-25 NOTE — Progress Notes (Signed)
Patient alert and oriented, son at bedside, ra, denies pain, no BM, clear liquid diet, npo after midnight, Hung MD colonoscopy in AM, IV fluids NS kvo, Go-lytely initiated, bedside commode at bedside, continuing to monitor

## 2013-08-25 NOTE — Progress Notes (Signed)
Patient transferred from 2S via wheelchair report given by [facility] RN, IV fluids, no c/o pain, no diarrhea, alert and oriented x4, continuing to monitor, non-tele

## 2013-08-25 NOTE — Progress Notes (Signed)
Pt seen and examined, admitted this am per Dr.Jenkins please see H&P for details Admitted this am with rectal bleeding, episode of dark red blood PR. Long standing h/o small amount of bleeding with BMs Very poor historian, cannot recall last colonoscopy Monitor CBC, continue PPI Will request GI eval Hold ASA, continue other home meds  Sheila Polite, MD [phone]

## 2013-08-26 ENCOUNTER — Encounter (HOSPITAL_COMMUNITY)
Admission: EM | Disposition: A | Discharge status: Home / Self Care | Payer: Self-pay | Source: Home / Self Care | Attending: Internal Medicine

## 2013-08-26 ENCOUNTER — Encounter (HOSPITAL_COMMUNITY): Payer: Self-pay

## 2013-08-26 DIAGNOSIS — I10 Essential (primary) hypertension: Secondary | ICD-10-CM

## 2013-08-26 HISTORY — PX: COLONOSCOPY: SHX5424

## 2013-08-26 LAB — CBC
HCT: 36.1 % (ref 36.0–46.0)
HEMATOCRIT: 40.6 % (ref 36.0–46.0)
HEMOGLOBIN: 11.9 g/dL — AB (ref 12.0–15.0)
Hemoglobin: 13.6 g/dL (ref 12.0–15.0)
MCH: 34.1 pg — ABNORMAL HIGH (ref 26.0–34.0)
MCH: 33.5 pg (ref 26.0–34.0)
MCHC: 33.5 g/dL (ref 30.0–36.0)
MCHC: 33 g/dL (ref 30.0–36.0)
MCV: 101.8 fL — ABNORMAL HIGH (ref 78.0–100.0)
MCV: 101.7 fL — ABNORMAL HIGH (ref 78.0–100.0)
Platelets: 146 10*3/uL — ABNORMAL LOW (ref 150–400)
Platelets: 126 10*3/uL — ABNORMAL LOW (ref 150–400)
RBC: 3.99 MIL/uL (ref 3.87–5.11)
RBC: 3.55 MIL/uL — AB (ref 3.87–5.11)
RDW: 12.5 % (ref 11.5–15.5)
RDW: 12.5 % (ref 11.5–15.5)
WBC: 10.9 10*3/uL — AB (ref 4.0–10.5)
WBC: 5.9 10*3/uL (ref 4.0–10.5)

## 2013-08-26 LAB — BASIC METABOLIC PANEL
ANION GAP: 14 (ref 5–15)
BUN: 6 mg/dL (ref 6–23)
CALCIUM: 8.8 mg/dL (ref 8.4–10.5)
CO2: 23 mEq/L (ref 19–32)
Chloride: 110 mEq/L (ref 96–112)
Creatinine, Ser: 0.48 mg/dL — ABNORMAL LOW (ref 0.50–1.10)
GFR calc Af Amer: >90 mL/min (ref >90)
GFR calc non Af Amer: 89 mL/min — ABNORMAL LOW (ref >90)
Glucose, Bld: 100 mg/dL — ABNORMAL HIGH (ref 70–99)
Potassium: 4.8 mEq/L (ref 3.7–5.3)
SODIUM: 147 meq/L (ref 137–147)

## 2013-08-26 SURGERY — COLONOSCOPY
Anesthesia: Moderate Sedation

## 2013-08-26 MED ORDER — MIDAZOLAM HCL 5 MG/5ML IJ SOLN
INTRAMUSCULAR | Status: DC | PRN
  Administered 2013-08-26 (×4): 1 mg via INTRAVENOUS

## 2013-08-26 MED ORDER — FENTANYL CITRATE 0.05 MG/ML IJ SOLN
INTRAMUSCULAR | Status: AC
  Filled 2013-08-26: qty 2

## 2013-08-26 MED ORDER — FENTANYL CITRATE 0.05 MG/ML IJ SOLN
INTRAMUSCULAR | Status: DC | PRN
  Administered 2013-08-26 (×2): 12.5 ug via INTRAVENOUS
  Administered 2013-08-26: 25 ug via INTRAVENOUS
  Administered 2013-08-26 (×2): 12.5 ug via INTRAVENOUS

## 2013-08-26 MED ORDER — MIDAZOLAM HCL 5 MG/ML IJ SOLN
INTRAMUSCULAR | Status: AC
  Filled 2013-08-26: qty 2

## 2013-08-26 NOTE — H&P (View-Only) (Signed)
Unassigned Consult  Reason for Consult: Hematochezia Referring Physician: [facility]  [name] HPI: This is an 80 year old female with a PMH of HTN, hyperlipidemia, s/p porcine AVR, and diverticula on CT scan admitted to the hospital for hematochezia.  She was noticed to have maroon colored stools and the bleeding was painless.  In the distant past she had a colonoscopy, but she cannot recall the findings.  Her HGB did drop down from 14 to 11 and it was associated with hypotension.  Her son is the one who brought her to the hospital and she has not reported any of her symptoms to her son.  She is a poor historian and she states that this bleeding has been ongoing "for a while".  Past Medical History  Diagnosis Date  . Hypertension   . Hyperlipidemia   . Thyroid disease     hypothyroidism   . Chronic constipation   . A-fib     perioperative, no recurrence  . Aortic valve prosthesis present   . History of echocardiogram 2008    intact LVEF, 65% nl AVR fxn, mod bileaflet MV thickening  . Hx of echocardiogram 2015    Echo (04/2013): Focal basal hypertrophy, EF 50-55%, normal wall motion, mild MR, PASP 33 mm Hg, AVR okay (mean gradient 6 mmHg)    History reviewed. No pertinent past surgical history.  Family History  Problem Relation Age of Onset  . Heart disease Mother   . Heart disease Father     Social History:  reports that she has never smoked. She does not have any smokeless tobacco history on file. She reports that she does not drink alcohol or use illicit drugs.  Allergies:  Allergies  Allergen Reactions  . Bactrim [Sulfamethoxazole-Tmp Ds]     unknown  . Celebrex [Celecoxib]     unknown  . Codeine     Make me sick     Medications:  Scheduled: . atorvastatin  40 mg Oral q1800  . levothyroxine  75 mcg Oral Once per day on Mon Tue Wed Thu Fri  . pantoprazole (PROTONIX) IV  40 mg Intravenous Q12H  . sodium chloride  250 mL Intravenous Once    Continuous: . sodium chloride 75 mL/hr (08/25/13 0750)    Results for orders placed during the hospital encounter of 08/24/13 (from the past 24 hour(s))  CBC     Status: Abnormal   Collection Time    08/24/13  7:17 PM      Result Value Ref Range   WBC 9.3  4.0 - 10.5 K/uL   RBC 4.06  3.87 - 5.11 MIL/uL   Hemoglobin 14.0  12.0 - 15.0 g/dL   HCT 40.6  36.0 - 46.0 %   MCV 100.0  78.0 - 100.0 fL   MCH 34.5 (*) 26.0 - 34.0 pg   MCHC 34.5  30.0 - 36.0 g/dL   RDW 12.6  11.5 - 15.5 %   Platelets 166  150 - 400 K/uL  BASIC METABOLIC PANEL     Status: Abnormal   Collection Time    08/24/13  7:17 PM      Result Value Ref Range   Sodium 140  137 - 147 mEq/L   Potassium 3.9  3.7 - 5.3 mEq/L   Chloride 103  96 - 112 mEq/L   CO2 23  19 - 32 mEq/L   Glucose, Bld 119 (*) 70 - 99 mg/dL   BUN 9  6 - 23 mg/dL  Creatinine, Ser 0.45 (*) 0.50 - 1.10 mg/dL   Calcium 8.5  8.4 - 10.5 mg/dL   GFR calc non Af Amer >90  >90 mL/min   GFR calc Af Amer >90  >90 mL/min   Anion gap 14  5 - 15  POC OCCULT BLOOD, ED     Status: Abnormal   Collection Time    08/24/13 11:05 PM      Result Value Ref Range   Fecal Occult Bld POSITIVE (*) NEGATIVE  TYPE AND SCREEN     Status: None   Collection Time    08/25/13 12:25 AM      Result Value Ref Range   ABO/RH(D) O POS     Antibody Screen NEG     Sample Expiration 08/28/2013    TROPONIN I     Status: None   Collection Time    08/25/13 12:25 AM      Result Value Ref Range   Troponin I <0.30  <0.30 ng/mL  ABO/RH     Status: None   Collection Time    08/25/13 12:25 AM      Result Value Ref Range   ABO/RH(D) O POS    I-STAT CG4 LACTIC ACID, ED     Status: None   Collection Time    08/25/13 12:33 AM      Result Value Ref Range   Lactic Acid, Venous 1.20  0.5 - 2.2 mmol/L  URINALYSIS, ROUTINE W REFLEX MICROSCOPIC     Status: Abnormal   Collection Time    08/25/13  1:07 AM      Result Value Ref Range   Color, Urine YELLOW  YELLOW   APPearance CLOUDY  (*) CLEAR   Specific Gravity, Urine 1.007  1.005 - 1.030   pH 7.0  5.0 - 8.0   Glucose, UA NEGATIVE  NEGATIVE mg/dL   Hgb urine dipstick SMALL (*) NEGATIVE   Bilirubin Urine NEGATIVE  NEGATIVE   Ketones, ur NEGATIVE  NEGATIVE mg/dL   Protein, ur NEGATIVE  NEGATIVE mg/dL   Urobilinogen, UA 1.0  0.0 - 1.0 mg/dL   Nitrite NEGATIVE  NEGATIVE   Leukocytes, UA SMALL (*) NEGATIVE  URINE MICROSCOPIC-ADD ON     Status: Abnormal   Collection Time    08/25/13  1:07 AM      Result Value Ref Range   Squamous Epithelial / LPF RARE  RARE   WBC, UA 11-20  <3 WBC/hpf   RBC / HPF 3-6  <3 RBC/hpf   Bacteria, UA RARE  RARE   Casts HYALINE CASTS (*) NEGATIVE   Urine-Other MUCOUS PRESENT    MRSA PCR SCREENING     Status: None   Collection Time    08/25/13  1:56 AM      Result Value Ref Range   MRSA by PCR NEGATIVE  NEGATIVE  BASIC METABOLIC PANEL     Status: Abnormal   Collection Time    08/25/13  2:50 AM      Result Value Ref Range   Sodium 143  137 - 147 mEq/L   Potassium 4.2  3.7 - 5.3 mEq/L   Chloride 111  96 - 112 mEq/L   CO2 22  19 - 32 mEq/L   Glucose, Bld 101 (*) 70 - 99 mg/dL   BUN 10  6 - 23 mg/dL   Creatinine, Ser 0.48 (*) 0.50 - 1.10 mg/dL   Calcium 7.6 (*) 8.4 - 10.5 mg/dL   GFR calc non Af Amer 89 (*) >  90 mL/min   GFR calc Af Amer >90  >90 mL/min   Anion gap 10  5 - 15  CBC     Status: Abnormal   Collection Time    08/25/13  2:50 AM      Result Value Ref Range   WBC 9.3  4.0 - 10.5 K/uL   RBC 3.43 (*) 3.87 - 5.11 MIL/uL   Hemoglobin 11.7 (*) 12.0 - 15.0 g/dL   HCT 35.1 (*) 36.0 - 46.0 %   MCV 102.3 (*) 78.0 - 100.0 fL   MCH 34.1 (*) 26.0 - 34.0 pg   MCHC 33.3  30.0 - 36.0 g/dL   RDW 12.8  11.5 - 15.5 %   Platelets 125 (*) 150 - 400 K/uL     Dg Chest Port 1 View  08/25/2013   CLINICAL DATA:  Hypotension.  Weakness.  EXAM: PORTABLE CHEST - 1 VIEW  COMPARISON:  Chest x-ray 06/23/2013.  FINDINGS: Lung volumes are normal. No consolidative airspace disease. No pleural  effusions. No pneumothorax. No pulmonary nodule or mass noted. Pulmonary vasculature and the cardiomediastinal silhouette are within normal limits. Atherosclerosis in the thoracic aorta. Status post median sternotomy. Surgical clips project over the right upper quadrant of the abdomen.  IMPRESSION: 1.  No radiographic evidence of acute cardiopulmonary disease. 2. Atherosclerosis. 3. Postoperative changes, as above.   Electronically Signed   By: Vinnie Langton M.D.   On: 08/25/2013 00:19    ROS:  As stated above in the HPI otherwise negative.  Blood pressure 102/57, pulse 77, temperature 97.8 F (36.6 C), temperature source Oral, resp. rate 17, SpO2 97.00%.    PE: Gen: NAD, Alert and Oriented HEENT:  [place]/AT, EOMI Neck: Supple, no LAD Lungs: CTA Bilaterally CV: RRR without M/G/R ABM: Soft, NTND, +BS Ext: No C/C/E  Assessment/Plan: 1) Diverticular bleed. 2) S/p Porcine AVR. 3) HTN.   The patient is agreeable to undergoing the colonoscopy.  I suspect that she has a diverticular bleed, but she needs a follow up colonoscopy.  Plan: 1) Colonoscopy tomorrow. Khadar Monger D 08/25/2013, 9:23 AM

## 2013-08-26 NOTE — Discharge Summary (Signed)
Physician Discharge Summary  [name] Hope GDJ:[id number] DOB: 03/25/1929 DOA: 08/24/2013  PCP: Abigail Miyamoto, MD  Admit date: 08/24/2013 Discharge date: 08/26/2013  Time spent: 35 minutes  Recommendations for Outpatient Follow-up:  1. PCP in 1 week 2. Dr.Hung in 2weeks, FU on biopsy results  Discharge Diagnoses:  Principal Problem:   Rectal bleeding   Diverticula   Other and unspecified hyperlipidemia   Aortic valve disorders   Hypotension   Stage I pressure ulcer of sacral region   Hypothyroid     Discharge Condition:stable  Diet recommendation: regular  There were no vitals filed for this visit.  History of present illness:  Chief Complaint: Rectal Bleeding  HPI: [name] is a 80 y.o. female with a history of HTN. Hyperlipidemia, and S/P Porcine AVR who was brought to the ED after her son noticed that she had passed dark red blood from her rectum. She did not have complaints of ABD pain or nausea or vomiting. She has weakness but denies SOB, fatigue and Chest pain. She has not had any similar symptoms in the past per her son.   Hospital Course:  Admitted with Rectal bleeding, episode of dark red blood PR.  -she reported Long standing h/o small amount of bleeding with BMs  -Very poor historian, she could not recall last colonoscopy. -Had transient hypotension which resolved -Was seen by Dr.Hung in consultation, underwent colonoscopy today which revealed polyps s/p biopsy and diverticula which were the likely source of her bleeding. -Hb stable and didn't require any transfusions, she did not have any evidence of bleeding in the hospital.  Otherwise remained stable and continued on home medications  Procedures:  Colonoscopy  Consultations:  Gi Dr.Hung  Discharge Exam: Filed Vitals:   08/26/13 1501  BP: 164/65  Pulse: 81  Temp:   Resp: 15    General:AAOx3 Cardiovascular: S1S2/RRR Respiratory: CTAB  Discharge Instructions You were cared for by a  hospitalist during your hospital stay. If you have any questions about your discharge medications or the care you received while you were in the hospital after you are discharged, you can call the unit and asked to speak with the hospitalist on call if the hospitalist that took care of you is not available. Once you are discharged, your primary care physician will handle any further medical issues. Please note that NO REFILLS for any discharge medications will be authorized once you are discharged, as it is imperative that you return to your primary care physician (or establish a relationship with a primary care physician if you do not have one) for your aftercare needs so that they can reassess your need for medications and monitor your lab values.     Medication List         aspirin 81 MG tablet  Take 81 mg by mouth daily.     atorvastatin 40 MG tablet  Commonly known as:  LIPITOR  Take 1 tablet (40 mg total) by mouth daily at 6 PM.     augmented betamethasone dipropionate 0.05 % ointment  Commonly known as:  DIPROLENE-AF  Apply 1 application topically daily as needed. For eczema     levothyroxine 75 MCG tablet  Commonly known as:  SYNTHROID, LEVOTHROID  Take 75 mcg by mouth daily before breakfast. Only 5 days a week--none on sat/sun     metoprolol 50 MG tablet  Commonly known as:  LOPRESSOR  Take 50 mg by mouth 2 (two) times daily.  Allergies  Allergen Reactions  . Bactrim [Sulfamethoxazole-Tmp Ds]     unknown  . Celebrex [Celecoxib]     unknown  . Codeine     Make me sick        Follow-up Information   Follow up with FRIED, Jaymes Graff, MD.   Specialty:  Family Medicine   Contact information:   [place] [address] [phone]       Follow up with Beryle Beams, MD.   Specialty:  Gastroenterology   Contact information:   [address] [place] [address] [phone]        The results of significant diagnostics from  this hospitalization (including imaging, microbiology, ancillary and laboratory) are listed below for reference.    Significant Diagnostic Studies: Dg Chest Port 1 View  08/25/2013   CLINICAL DATA:  Hypotension.  Weakness.  EXAM: PORTABLE CHEST - 1 VIEW  COMPARISON:  Chest x-ray 06/23/2013.  FINDINGS: Lung volumes are normal. No consolidative airspace disease. No pleural effusions. No pneumothorax. No pulmonary nodule or mass noted. Pulmonary vasculature and the cardiomediastinal silhouette are within normal limits. Atherosclerosis in the thoracic aorta. Status post median sternotomy. Surgical clips project over the right upper quadrant of the abdomen.  IMPRESSION: 1.  No radiographic evidence of acute cardiopulmonary disease. 2. Atherosclerosis. 3. Postoperative changes, as above.   Electronically Signed   By: Vinnie Langton M.D.   On: 08/25/2013 00:19    Microbiology: Recent Results (from the past 240 hour(s))  MRSA PCR SCREENING     Status: None   Collection Time    08/25/13  1:56 AM      Result Value Ref Range Status   MRSA by PCR NEGATIVE  NEGATIVE Final   Comment:            The GeneXpert MRSA Assay (FDA     approved for NASAL specimens     only), is one component of a     comprehensive MRSA colonization     surveillance program. It is not     intended to diagnose MRSA     infection nor to guide or     monitor treatment for     MRSA infections.     Labs: Basic Metabolic Panel:  Recent Labs Lab 08/24/13 1917 08/25/13 0250 08/26/13 0126  NA 140 143 147  K 3.9 4.2 4.8  CL 103 111 110  CO2 23 22 23   GLUCOSE 119* 101* 100*  BUN 9 10 6   CREATININE 0.45* 0.48* 0.48*  CALCIUM 8.5 7.6* 8.8   Liver Function Tests: No results found for this basename: AST, ALT, ALKPHOS, BILITOT, PROT, ALBUMIN,  in the last 168 hours No results found for this basename: LIPASE, AMYLASE,  in the last 168 hours No results found for this basename: AMMONIA,  in the last 168 hours CBC:  Recent  Labs Lab 08/24/13 1917 08/25/13 0250 08/25/13 1456 08/26/13 0126  WBC 9.3 9.3 6.5 10.9*  HGB 14.0 11.7* 11.2* 13.6  HCT 40.6 35.1* 33.2* 40.6  MCV 100.0 102.3* 102.2* 101.8*  PLT 166 125* 110* 146*   Cardiac Enzymes:  Recent Labs Lab 08/25/13 0025  TROPONINI <0.30   BNP: BNP (last 3 results) No results found for this basename: PROBNP,  in the last 8760 hours CBG: No results found for this basename: GLUCAP,  in the last 168 hours     Signed:  Eyvette Cordon  [facility] 08/26/2013, 3:24 PM

## 2013-08-26 NOTE — Clinical Documentation Improvement (Signed)
Presents with melena; abnormal lab value  Platelet count this admission is ranging from 166 - 110.    Please provide a diagnosis associated with the above clinical data and document findings in next progress note and discharge summary if clinically significant.                                    Thank You, Gatha Mayer ,RN Clinical Documentation Specialist:    [phone] [place] [employer]

## 2013-08-26 NOTE — Interval H&P Note (Signed)
History and Physical Interval Note:  08/26/2013 2:57 PM  [name]  has presented today for surgery, with the diagnosis of Hematochezia and anemia  The various methods of treatment have been discussed with the patient and family. After consideration of risks, benefits and other options for treatment, the patient has consented to  Procedure(s): COLONOSCOPY (N/A) as a surgical intervention .  The patient's history has been reviewed, patient examined, no change in status, stable for surgery.  I have reviewed the patient's chart and labs.  Questions were answered to the patient's satisfaction.     Syed Zukas D

## 2013-08-26 NOTE — Op Note (Signed)
[facility] [place], [address]   OPERATIVE PROCEDURE REPORT  PATIENT: [name], [name]  MR#: [id number] BIRTHDATE: Nov 26, 1929  GENDER: Female ENDOSCOPIST: Carol Ada, MD ASSISTANT:   William Dalton, technician Cleda Daub, RN CGRN PROCEDURE DATE: 08/26/2013 PROCEDURE:   Colonoscopy with snare polypectomy ASA CLASS:   Class III INDICATIONS:Rectal Bleeding. MEDICATIONS: Versed 6 mg IV and Fentanyl 75 mcg IV  DESCRIPTION OF PROCEDURE:   After the risks benefits and alternatives of the procedure were thoroughly explained, informed consent was obtained.  A digital rectal exam revealed no abnormalities of the rectum.    The Pentax Adult Colon [id number] endoscope was introduced through the anus  and advanced to the cecum, which was identified by both the appendix and ileocecal valve , No adverse events experienced.    The quality of the prep was excellent. .  The instrument was then slowly withdrawn as the colon was fully examined.     FINDINGS: A 3 mm sessile ascending colon polyp was removed with a cold snare.  An 8 mm partially sessile sigmoid colon polyp was remove with a hot snare.  Scattered left sided diverticula were identified.  No other abnormalities noted.   Retroflexed views revealed no abnormalities.     The scope was then withdrawn from the patient and the procedure terminated.  COMPLICATIONS: There were no complications.  IMPRESSION: 1) Polyps. 2) Diverticula - This is most likely the source of the hematochezia.   RECOMMENDATIONS: 1) Await biopsy results. 2) Given the patient's age, current findings, and comorbidities, I do not recommend a follow up colonoscopy.  _______________________________ eSigned:  Carol Ada, MD 08/26/2013 3:52 PM

## 2013-08-27 DIAGNOSIS — K5731 Diverticulosis of large intestine without perforation or abscess with bleeding: Principal | ICD-10-CM | POA: Diagnosis present

## 2013-08-27 LAB — CBC
HEMATOCRIT: 32.1 % — AB (ref 36.0–46.0)
HEMOGLOBIN: 11 g/dL — AB (ref 12.0–15.0)
MCH: 34.4 pg — ABNORMAL HIGH (ref 26.0–34.0)
MCHC: 34.3 g/dL (ref 30.0–36.0)
MCV: 100.3 fL — AB (ref 78.0–100.0)
Platelets: 121 10*3/uL — ABNORMAL LOW (ref 150–400)
RBC: 3.2 MIL/uL — AB (ref 3.87–5.11)
RDW: 12.7 % (ref 11.5–15.5)
WBC: 5.6 10*3/uL (ref 4.0–10.5)

## 2013-08-27 NOTE — Progress Notes (Signed)
Pt discharged home with son. Discharge instructions reviewed, question answered.

## 2013-08-29 ENCOUNTER — Encounter (HOSPITAL_COMMUNITY): Payer: Self-pay | Admitting: Gastroenterology

## 2014-04-20 ENCOUNTER — Ambulatory Visit (INDEPENDENT_AMBULATORY_CARE_PROVIDER_SITE_OTHER): Payer: Medicare Other | Admitting: Interventional Cardiology

## 2014-04-20 ENCOUNTER — Encounter: Payer: Self-pay | Admitting: Interventional Cardiology

## 2014-04-20 VITALS — BP 138/70 | HR 77 | Ht 64.0 in | Wt 103.0 lb

## 2014-04-20 DIAGNOSIS — R634 Abnormal weight loss: Secondary | ICD-10-CM

## 2014-04-20 DIAGNOSIS — I10 Essential (primary) hypertension: Secondary | ICD-10-CM | POA: Diagnosis not present

## 2014-04-20 DIAGNOSIS — I359 Nonrheumatic aortic valve disorder, unspecified: Secondary | ICD-10-CM

## 2014-04-20 NOTE — Patient Instructions (Signed)
Your physician recommends that you continue on your current medications as directed. Please refer to the Current Medication list given to you today.   Your physician wants you to follow-up in:  [place] will receive a reminder letter in the mail two months in advance. If you don't receive a letter, please call our office to schedule the follow-up appointment.    NEEDS DENTAL CLEANING WITH Antibiotic prophylaxis

## 2014-04-20 NOTE — Progress Notes (Signed)
Patient ID: [name], female   DOB: 1929-01-23, 81 y.o.   MRN: [id number]     Cardiology Office Note   Date:  04/20/2014   ID:  [name], DOB 10/26/1929, MRN [id number]  PCP:  Abigail Miyamoto, MD    No chief complaint on file. AVR   Wt Readings from Last 3 Encounters:  04/20/14 103 lb (46.72 kg)  04/26/13 114 lb (51.71 kg)       History of Present Illness: [name] is a 81 y.o. female  with a hx of AS, s/p bioprosthetic AVR 10/2004, perioperative AFib, HTN, HL, LBBB, hypothyroidism. Last seen 02/2012.  She returns for annual follow up.  She saw Nicki Reaper last year.  She is doing well. She denies chest pain or significant dyspnea. She sleeps on 1- 2 pillows chronically. She denies PND. She denies significant edema- occasional pedal edema. She denies syncope.  She is not exercising much- not for any physical reasons.   She just does not want to.  She does not eat much due to lack of appetite.     Past Medical History  Diagnosis Date  . Hypertension   . Hyperlipidemia   . Thyroid disease     hypothyroidism   . Chronic constipation   . A-fib     perioperative, no recurrence  . Aortic valve prosthesis present   . History of echocardiogram 2008    intact LVEF, 65% nl AVR fxn, mod bileaflet MV thickening  . Hx of echocardiogram 2015    Echo (04/2013): Focal basal hypertrophy, EF 50-55%, normal wall motion, mild MR, PASP 33 mm Hg, AVR okay (mean gradient 6 mmHg)    Past Surgical History  Procedure Laterality Date  . Colonoscopy N/A 08/26/2013    Procedure: COLONOSCOPY;  Surgeon: Beryle Beams, MD;  Location: [facility] ENDOSCOPY;  Service: Endoscopy;  Laterality: N/A;     Current Outpatient Prescriptions  Medication Sig Dispense Refill  . aspirin 81 MG tablet Take 81 mg by mouth daily.    Marland Kitchen atorvastatin (LIPITOR) 40 MG tablet Take 1 tablet (40 mg total) by mouth daily at 6 PM. 90 tablet 3  . augmented betamethasone dipropionate (DIPROLENE-AF) 0.05 % ointment Apply  1 application topically daily as needed. For eczema    . levothyroxine (SYNTHROID, LEVOTHROID) 75 MCG tablet Take 75 mcg by mouth daily before breakfast. Only 5 days a week--none on sat/sun    . metoprolol (LOPRESSOR) 50 MG tablet Take 50 mg by mouth 2 (two) times daily.     No current facility-administered medications for this visit.    Allergies:   Bactrim; Celebrex; and Codeine    Social History:  The patient  reports that she has never smoked. She does not have any smokeless tobacco history on file. She reports that she does not drink alcohol or use illicit drugs.   Family History:  The patient's *family history includes Heart disease in her father and mother.    ROS:  Please see the history of present illness.   Otherwise, review of systems are positive for edema.   All other systems are reviewed and negative.    PHYSICAL EXAM: VS:  BP 138/70 mmHg  Pulse 77  Ht 5\' 4"  (1.626 m)  Wt 103 lb (46.72 kg)  BMI 17.67 kg/m2 , BMI Body mass index is 17.67 kg/(m^2). GEN: Well nourished, well developed, in no acute distress HEENT: normal Neck: no JVD, carotid bruits, or masses Cardiac: RRR; no murmurs, rubs, or  gallops,ankle edema L>R Respiratory:  clear to auscultation bilaterally, normal work of breathing GI: soft, nontender, nondistended, + BS MS: no deformity or atrophy Skin: warm and dry, no rash Neuro:  Strength and sensation are intact Psych: euthymic mood, flat affect   EKG:   The ekg from 8/15 demonstrates NSR, LBBB   Recent Labs: 08/25/2013: TSH 0.357 08/26/2013: BUN 6; Creatinine 0.48*; Potassium 4.8; Sodium 147 08/27/2013: Hemoglobin 11.0*; Platelets 121*   Lipid Panel No results found for: CHOL, TRIG, HDL, CHOLHDL, VLDL, LDLCALC, LDLDIRECT   Other studies Reviewed: Additional studies/ records that were reviewed today with results demonstrating: echo from 2015.   ASSESSMENT AND PLAN:  1. AVR: Well functioning AVR from 2006.  She needs SBE prophylaxis prior to  dental cleaning.  SHe should go twice a year.   2. Weight loss: try boost shakes to maintain weight. 3. Hyperlipidemia: followed by Dr. Maceo Pro. Continue atorvastatin. 4. HTN: Controlled COntinue current meds.   Current medicines are reviewed at length with the patient today.  The patient concerns regarding her medicines were addressed.  The following changes have been made:  No change  Labs/ tests ordered today include:  No orders of the defined types were placed in this encounter.    Recommend 150 minutes/week of aerobic exercise Low fat, low carb, high fiber diet recommended  Disposition:   FU in 1 yr   Teresita Madura., MD  04/20/2014 11:22 AM    [facility] [place], [place], [place]  [address] Phone: [phone]; Fax: [phone]

## 2014-04-22 ENCOUNTER — Other Ambulatory Visit: Payer: Self-pay | Admitting: Physician Assistant

## 2014-04-24 ENCOUNTER — Other Ambulatory Visit: Payer: Self-pay | Admitting: *Deleted

## 2014-04-24 DIAGNOSIS — I10 Essential (primary) hypertension: Secondary | ICD-10-CM

## 2014-04-24 MED ORDER — ATORVASTATIN CALCIUM 40 MG PO TABS: 40.0000 mg | ORAL_TABLET | Freq: Every day | ORAL | Status: AC

## 2014-10-03 ENCOUNTER — Other Ambulatory Visit (HOSPITAL_BASED_OUTPATIENT_CLINIC_OR_DEPARTMENT_OTHER): Payer: Self-pay | Admitting: Family Medicine

## 2014-10-03 DIAGNOSIS — R748 Abnormal levels of other serum enzymes: Secondary | ICD-10-CM

## 2014-10-03 DIAGNOSIS — R634 Abnormal weight loss: Secondary | ICD-10-CM

## 2014-10-16 ENCOUNTER — Ambulatory Visit (HOSPITAL_BASED_OUTPATIENT_CLINIC_OR_DEPARTMENT_OTHER)
Admission: RE | Admit: 2014-10-16 | Discharge: 2014-10-16 | Disposition: A | Discharge status: Home / Self Care | Payer: Medicare Other | Source: Ambulatory Visit | Attending: Family Medicine | Admitting: Family Medicine

## 2014-10-16 DIAGNOSIS — R748 Abnormal levels of other serum enzymes: Secondary | ICD-10-CM | POA: Diagnosis present

## 2014-10-16 DIAGNOSIS — R630 Anorexia: Secondary | ICD-10-CM | POA: Insufficient documentation

## 2014-10-16 DIAGNOSIS — R634 Abnormal weight loss: Secondary | ICD-10-CM

## 2014-10-16 DIAGNOSIS — Z9049 Acquired absence of other specified parts of digestive tract: Secondary | ICD-10-CM | POA: Insufficient documentation

## 2015-05-11 ENCOUNTER — Encounter: Payer: Self-pay | Admitting: Interventional Cardiology

## 2015-05-11 ENCOUNTER — Ambulatory Visit (INDEPENDENT_AMBULATORY_CARE_PROVIDER_SITE_OTHER): Payer: Medicare Other | Admitting: Interventional Cardiology

## 2015-05-11 VITALS — BP 138/70 | HR 72 | Ht 64.0 in | Wt 101.8 lb

## 2015-05-11 DIAGNOSIS — I359 Nonrheumatic aortic valve disorder, unspecified: Secondary | ICD-10-CM | POA: Diagnosis not present

## 2015-05-11 DIAGNOSIS — I10 Essential (primary) hypertension: Secondary | ICD-10-CM | POA: Diagnosis not present

## 2015-05-11 DIAGNOSIS — E785 Hyperlipidemia, unspecified: Secondary | ICD-10-CM

## 2015-05-11 NOTE — Progress Notes (Signed)
Patient ID: [name], female   DOB: 1929-11-28, 82 y.o.   MRN: [id number]     Cardiology Office Note   Date:  05/11/2015   ID:  [name], DOB [birth year], MRN [id number]  PCP:  Sheila Martinique, MD    No chief complaint on file.    Wt Readings from Last 3 Encounters:  05/11/15 101 lb 12.8 oz (46.176 kg)  04/20/14 103 lb (46.72 kg)  04/26/13 114 lb (51.71 kg)       History of Present Illness: [name] is a 82 y.o. female  with a hx of AS, s/p bioprosthetic AVR 10/2004, perioperative AFib, HTN, HL, LBBB, hypothyroidism. Last seen 2016.  She returns for annual follow up. She is doing well. She denies chest pain or significant dyspnea. She sleeps on 1- 2 pillows chronically. She denies PND. She denies significant edema- occasional pedal edema. She denies syncope. She is not exercising much- not for any physical reasons. She just does not want to.  She feels ok if she walks in the house.  She is generally weak.  She avoids the dentist.  She does not eat much due to lack of appetite.     Past Medical History  Diagnosis Date  . Hypertension   . Hyperlipidemia   . Thyroid disease     hypothyroidism   . Chronic constipation   . A-fib (HCC)     perioperative, no recurrence  . Aortic valve prosthesis present   . History of echocardiogram 2008    intact LVEF, 65% nl AVR fxn, mod bileaflet MV thickening  . Hx of echocardiogram 2015    Echo (04/2013): Focal basal hypertrophy, EF 50-55%, normal wall motion, mild MR, PASP 33 mm Hg, AVR okay (mean gradient 6 mmHg)    Past Surgical History  Procedure Laterality Date  . Colonoscopy N/A 08/26/2013    Procedure: COLONOSCOPY;  Surgeon: Beryle Beams, MD;  Location: [facility] ENDOSCOPY;  Service: Endoscopy;  Laterality: N/A;     Current Outpatient Prescriptions  Medication Sig Dispense Refill  . aspirin 81 MG tablet Take 81 mg by mouth daily.    Marland Kitchen atorvastatin (LIPITOR) 40 MG tablet Take 1 tablet (40 mg total) by mouth  daily at 6 PM. 90 tablet 3  . augmented betamethasone dipropionate (DIPROLENE-AF) 0.05 % ointment Apply 1 application topically daily as needed. For eczema    . levothyroxine (SYNTHROID, LEVOTHROID) 50 MCG tablet Take 50 mcg by mouth daily before breakfast.    . metoprolol (LOPRESSOR) 50 MG tablet Take 50 mg by mouth 2 (two) times daily.    . polyethylene glycol (MIRALAX / GLYCOLAX) packet Take 17 g by mouth every other day.     No current facility-administered medications for this visit.    Allergies:   Bactrim; Celebrex; and Codeine    Social History:  The patient  reports that she has never smoked. She does not have any smokeless tobacco history on file. She reports that she does not drink alcohol or use illicit drugs.   Family History:  The patient's family history includes Heart attack in her mother; Heart disease in her father and mother; Hypertension in her mother. There is no history of Stroke.    ROS:  Please see the history of present illness.   Otherwise, review of systems are positive for poor appetite.   All other systems are reviewed and negative.    PHYSICAL EXAM: VS:  BP 138/70 mmHg  Pulse 72  Ht 5'  4" (1.626 m)  Wt 101 lb 12.8 oz (46.176 kg)  BMI 17.47 kg/m2 , BMI Body mass index is 17.47 kg/(m^2). GEN: Well nourished, well developed, in no acute distress HEENT: Buildup around bottom teeth Neck: no JVD, carotid bruits, or masses Cardiac: RRR; no murmurs, rubs, or gallops,no edema  Respiratory:  clear to auscultation bilaterally, normal work of breathing GI: soft, nontender, nondistended, + BS MS: no deformity or atrophy Skin: warm and dry, no rash Neuro:  Strength and sensation are intact Psych: euthymic mood, full affect   EKG:   The ekg ordered today demonstrates NSR, IVCD, no signficant ST segment changes   Recent Labs: No results found for requested labs within last 365 days.   Lipid Panel No results found for: CHOL, TRIG, HDL, CHOLHDL, VLDL,  LDLCALC, LDLDIRECT   Other studies Reviewed: Additional studies/ records that were reviewed today with results demonstrating: prior echo reviewed.   ASSESSMENT AND PLAN:  1. AVR: Well functioning AVR from 2006. She needs SBE prophylaxis prior to dental cleaning. SHe should go twice a year. She is hesitant to go to a dentist.  2. Hyperlipidemia: followed by Lupe [facility]ney at [facility]. Continue atorvastatin. 3. HTN: Controlled COntinue current meds.    Current medicines are reviewed at length with the patient today.  The patient concerns regarding her medicines were addressed.  The following changes have been made:  No change  Labs/ tests ordered today include:  Orders Placed This Encounter  Procedures  . EKG 12-Lead    Recommend 150 minutes/week of aerobic exercise Low fat, low carb, high fiber diet recommended  Disposition:   FU in 1 year   Teresita Madura., MD  05/11/2015 10:24 AM    [facility] [place], [place], [place]  [address] Phone: [phone]; Fax: [phone]

## 2015-05-11 NOTE — Patient Instructions (Signed)
**Note De-identified Momen Ham Obfuscation** Medication Instructions:  Same-no changes  Labwork: None  Testing/Procedures: None  Follow-Up: Your physician wants you to follow-up in: 1 year. You will receive a reminder letter in the mail two months in advance. If you don't receive a letter, please call our office to schedule the follow-up appointment.      If you need a refill on your cardiac medications before your next appointment, please call your pharmacy.   

## 2016-05-16 ENCOUNTER — Encounter (INDEPENDENT_AMBULATORY_CARE_PROVIDER_SITE_OTHER): Payer: Self-pay

## 2016-05-16 ENCOUNTER — Encounter: Payer: Self-pay | Admitting: Interventional Cardiology

## 2016-05-16 ENCOUNTER — Ambulatory Visit (INDEPENDENT_AMBULATORY_CARE_PROVIDER_SITE_OTHER): Payer: Medicare Other | Admitting: Interventional Cardiology

## 2016-05-16 VITALS — BP 126/68 | HR 77 | Ht 64.0 in | Wt 104.4 lb

## 2016-05-16 DIAGNOSIS — I10 Essential (primary) hypertension: Secondary | ICD-10-CM

## 2016-05-16 DIAGNOSIS — I447 Left bundle-branch block, unspecified: Secondary | ICD-10-CM | POA: Diagnosis not present

## 2016-05-16 DIAGNOSIS — E782 Mixed hyperlipidemia: Secondary | ICD-10-CM

## 2016-05-16 DIAGNOSIS — I359 Nonrheumatic aortic valve disorder, unspecified: Secondary | ICD-10-CM

## 2016-05-16 NOTE — Patient Instructions (Signed)

## 2016-05-16 NOTE — Progress Notes (Signed)
Patient ID: [name], female   DOB: [birth year], 83 y.o.   MRN: [id number]     Cardiology Office Note   Date:  05/16/2016   ID:  [name], DOB 1929-10-09, MRN [id number]  PCP:  Sheila Martinique, MD    No chief complaint on file. AVR   Wt Readings from Last 3 Encounters:  05/16/16 104 lb 6.4 oz (47.4 kg)  05/11/15 101 lb 12.8 oz (46.2 kg)  04/20/14 103 lb (46.7 kg)       History of Present Illness: [name] is a 83 y.o. female  with a hx of AS, s/p bioprosthetic AVR 10/2004, perioperative AFib, HTN, HL, LBBB, hypothyroidism. Last seen 2017.  She returns for annual follow up. She is doing well. She denies chest pain or significant dyspnea. She sleeps on 1- 2 pillows chronically. No SHOB. She denies significant edema- no more pedal edema recently.   She denies syncope. She is not exercising much. She just does not want to.  She does not leave the house much.  She feels ok if she walks in the house.  She is generally weak.  She avoids the dentist, but the son is looking for a dental appt.  They have ABx ready.  Appetite is better.  SHe has gained a few pounds.  No bleeding problems.  No problems with falling.  Gets around the house ok.     Past Medical History:  Diagnosis Date  . A-fib (HCC)    perioperative, no recurrence  . Aortic valve prosthesis present   . Chronic constipation   . History of echocardiogram 2008   intact LVEF, 65% nl AVR fxn, mod bileaflet MV thickening  . Hx of echocardiogram 2015   Echo (04/2013): Focal basal hypertrophy, EF 50-55%, normal wall motion, mild MR, PASP 33 mm Hg, AVR okay (mean gradient 6 mmHg)  . Hyperlipidemia   . Hypertension   . Thyroid disease    hypothyroidism     Past Surgical History:  Procedure Laterality Date  . COLONOSCOPY N/A 08/26/2013   Procedure: COLONOSCOPY;  Surgeon: Beryle Beams, MD;  Location: [facility];  Service: Endoscopy;  Laterality: N/A;     Current Outpatient Prescriptions  Medication  Sig Dispense Refill  . aspirin 81 MG tablet Take 81 mg by mouth daily.    Marland Kitchen atorvastatin (LIPITOR) 40 MG tablet Take 1 tablet (40 mg total) by mouth daily at 6 PM. 90 tablet 3  . augmented betamethasone dipropionate (DIPROLENE-AF) 0.05 % ointment Apply 1 application topically daily as needed. For eczema    . levothyroxine (SYNTHROID, LEVOTHROID) 50 MCG tablet Take 50 mcg by mouth daily before breakfast.    . metoprolol (LOPRESSOR) 50 MG tablet Take 50 mg by mouth 2 (two) times daily.    . polyethylene glycol (MIRALAX / GLYCOLAX) packet Take 17 g by mouth every other day.     No current facility-administered medications for this visit.     Allergies:   Bactrim [sulfamethoxazole-trimethoprim]; Celebrex [celecoxib]; and Codeine    Social History:  The patient  reports that she has never smoked. She has never used smokeless tobacco. She reports that she does not drink alcohol or use drugs.   Family History:  The patient's family history includes Heart attack in her mother; Heart disease in her father and mother; Hypertension in her mother.    ROS:  Please see the history of present illness.   Otherwise, review of systems are positive for poor appetite.  All other systems are reviewed and negative.    PHYSICAL EXAM: VS:  BP 126/68   Pulse 77   Ht 5\' 4"  (1.626 m)   Wt 104 lb 6.4 oz (47.4 kg)   SpO2 99%   BMI 17.92 kg/m  , BMI Body mass index is 17.92 kg/m. GEN: Well nourished, well developed, in no acute distress , frail HEENT: Buildup around bottom teeth; eyes are sunken in  Neck: no JVD, carotid bruits, or masses Cardiac: RRR; no murmurs, rubs, or gallops,no edema  Respiratory:  clear to auscultation bilaterally, normal work of breathing GI: soft, nontender, nondistended, + BS MS: no deformity or atrophy  Skin: warm and dry, no rash Neuro:  Strength and sensation are intact Psych: euthymic mood, full affect   EKG:   The ekg ordered today demonstrates NSR, LBBB,    Recent  Labs: No results found for requested labs within last 8760 hours.   Lipid Panel No results found for: CHOL, TRIG, HDL, CHOLHDL, VLDL, LDLCALC, LDLDIRECT   Other studies Reviewed: Additional studies/ records that were reviewed today with results demonstrating: echo reviewed from 2015.   ASSESSMENT AND PLAN:  1. AVR: Well functioning AVR from 2006 by exam.  Would not pursue echo at this time.. She needs SBE prophylaxis prior to dental cleaning. SHe should go twice a year. She is hesitant to go to a dentist but the son is trying to arrange this visit.  He has the antibiotics ready to give as prophylaxis.  2. Hyperlipidemia: followed by Sadie Haber at [facility]. Continue atorvastatin. 3. HTN: Controlled COntinue current meds. 4. Encouraged nutrition and walking.  She is somewhat frail but overall satisfied with her life at this time.  Walking in the house is fine.  I encouraged the exercise to help her maintain her stamina.     Current medicines are reviewed at length with the patient today.  The patient concerns regarding her medicines were addressed.  The following changes have been made:  No change  Labs/ tests ordered today include:  No orders of the defined types were placed in this encounter.   Recommend 150 minutes/week of aerobic exercise Low fat, low carb, high fiber diet recommended  Disposition:   FU in 1 year   Signed, Larae Grooms, MD  05/16/2016 11:35 AM    [facility] [place], [place], [place]  [address] Phone: [phone]; Fax: [phone]

## 2017-05-08 ENCOUNTER — Encounter: Payer: Self-pay | Admitting: Interventional Cardiology

## 2017-05-25 NOTE — Progress Notes (Signed)
Cardiology Office Note   Date:  05/26/2017   ID:  [name], DOB [birth year], MRN [id number]  PCP:  [name], MD    No chief complaint on file.  S/p AVR  Wt Readings from Last 3 Encounters:  05/26/17 96 lb 12.8 oz (43.9 kg)  05/16/16 104 lb 6.4 oz (47.4 kg)  05/11/15 101 lb 12.8 oz (46.2 kg)       History of Present Illness: [name] is a 84 y.o. female  with a hx of AS, s/p bioprosthetic AVR 10/2004, perioperative AFib, HTN, HL, LBBB, hypothyroidism.  Since last year, no significant changes.  She remains relatively sedentary.  She has had issues with falling.  Her son helps her get around.    She has a cane and a walker, but she does not use them regularly.  She does not get out of the house much.    She has lost weight since last year.  She does not eat much.  SHe only eats her favorite foods,cereal bars, yogurt, vanilla pound cake.     Past Medical History:  Diagnosis Date  . A-fib (HCC)    perioperative, no recurrence  . Aortic valve prosthesis present   . Chronic constipation   . History of echocardiogram 2008   intact LVEF, 65% nl AVR fxn, mod bileaflet MV thickening  . Hx of echocardiogram 2015   Echo (04/2013): Focal basal hypertrophy, EF 50-55%, normal wall motion, mild MR, PASP 33 mm Hg, AVR okay (mean gradient 6 mmHg)  . Hyperlipidemia   . Hypertension   . Thyroid disease    hypothyroidism     Past Surgical History:  Procedure Laterality Date  . COLONOSCOPY N/A 08/26/2013   Procedure: COLONOSCOPY;  Surgeon: Beryle Beams, MD;  Location: [facility];  Service: Endoscopy;  Laterality: N/A;     Current Outpatient Medications  Medication Sig Dispense Refill  . aspirin 81 MG tablet Take 81 mg by mouth daily.    Marland Kitchen atorvastatin (LIPITOR) 40 MG tablet Take 1 tablet (40 mg total) by mouth daily at 6 PM. 90 tablet 3  . augmented betamethasone dipropionate (DIPROLENE-AF) 0.05 % ointment Apply 1 application topically daily as needed.  For eczema    . levothyroxine (SYNTHROID, LEVOTHROID) 75 MCG tablet Take 75 mcg by mouth daily before breakfast.    . metoprolol (LOPRESSOR) 50 MG tablet Take 50 mg by mouth 2 (two) times daily.    . polyethylene glycol (MIRALAX / GLYCOLAX) packet Take 17 g by mouth daily as needed for mild constipation, moderate constipation or severe constipation.      No current facility-administered medications for this visit.     Allergies:   Bactrim [sulfamethoxazole-trimethoprim]; Celebrex [celecoxib]; and Codeine    Social History:  The patient  reports that she has never smoked. She has never used smokeless tobacco. She reports that she does not drink alcohol or use drugs.   Family History:  The patient's family history includes Heart attack in her mother; Heart disease in her father and mother; Hypertension in her mother.    ROS:  Please see the history of present illness.   Otherwise, review of systems are positive for weight loss.   All other systems are reviewed and negative.    PHYSICAL EXAM: VS:  BP 126/78   Pulse 74   Ht 5\' 4"  (1.626 m)   Wt 96 lb 12.8 oz (43.9 kg)   SpO2 98%   BMI 16.62 kg/m  ,  BMI Body mass index is 16.62 kg/m. GEN: Well nourished, well developed, in no acute distress  HEENT: normal  Neck: no JVD, carotid bruits, or masses Cardiac: RRR; no murmurs, rubs, or gallops,; mild ankle edema  Respiratory:  clear to auscultation bilaterally, normal work of breathing GI: soft, nontender, nondistended, + BS MS: no deformity or atrophy  Skin: warm and dry, no rash Neuro:  Strength and sensation are intact Psych: euthymic mood, full affect   EKG:   The ekg ordered today demonstrates NSR, LBBB   Recent Labs: No results found for requested labs within last 8760 hours.   Lipid Panel No results found for: CHOL, TRIG, HDL, CHOLHDL, VLDL, LDLCALC, LDLDIRECT   Other studies Reviewed: Additional studies/ records that were reviewed today with results demonstrating: 2015  echo:  - Left ventricle: The cavity size was normal. There was focal basal hypertrophy. Systolic function was normal. The estimated ejection fraction was in the range of 50% to 55%. Wall motion was normal; there were no regional wall motion abnormalities. - Aortic valve: A bioprosthesis was present. - Mitral valve: Mild regurgitation. - Pulmonary arteries: PA peak pressure: 49mm Hg (S)..   ASSESSMENT AND PLAN:  1. AVR: She does not go to the dentist.  Her son tries, but she refuses.  No signs of valve dysfunction by history or on exam. 2. Hyperlipidemia: LDL 92.  Continue atorvastatin. Followed by PMD.  3. HTN: Electrolytes controlled in 6/18.  Controlled at home.  4. LBBB: Stable.   5. Avoid falls.     Current medicines are reviewed at length with the patient today.  The patient concerns regarding her medicines were addressed.  The following changes have been made:  No change  Labs/ tests ordered today include:  No orders of the defined types were placed in this encounter.   Recommend 150 minutes/week of aerobic exercise Low fat, low carb, high fiber diet recommended  Disposition:   FU in 1 year   Signed, Larae Grooms, MD  05/26/2017 9:50 AM    [facility] [place], [place], [place]  [address] Phone: [phone]; Fax: [phone]

## 2017-05-26 ENCOUNTER — Encounter: Payer: Self-pay | Admitting: Interventional Cardiology

## 2017-05-26 ENCOUNTER — Ambulatory Visit: Payer: Medicare Other | Admitting: Interventional Cardiology

## 2017-05-26 VITALS — BP 126/78 | HR 74 | Ht 64.0 in | Wt 96.8 lb

## 2017-05-26 DIAGNOSIS — I10 Essential (primary) hypertension: Secondary | ICD-10-CM

## 2017-05-26 DIAGNOSIS — I447 Left bundle-branch block, unspecified: Secondary | ICD-10-CM

## 2017-05-26 DIAGNOSIS — E782 Mixed hyperlipidemia: Secondary | ICD-10-CM | POA: Diagnosis not present

## 2017-05-26 DIAGNOSIS — I359 Nonrheumatic aortic valve disorder, unspecified: Secondary | ICD-10-CM

## 2017-05-26 NOTE — Patient Instructions (Signed)

## 2017-09-06 ENCOUNTER — Encounter (HOSPITAL_COMMUNITY): Payer: Self-pay | Admitting: Emergency Medicine

## 2017-09-06 ENCOUNTER — Emergency Department (HOSPITAL_COMMUNITY): Payer: Medicare Other

## 2017-09-06 ENCOUNTER — Observation Stay (HOSPITAL_COMMUNITY)
Admission: EM | Admit: 2017-09-06 | Discharge: 2017-09-07 | Disposition: A | Discharge status: Home / Self Care | Payer: Medicare Other | Attending: Internal Medicine | Admitting: Internal Medicine

## 2017-09-06 DIAGNOSIS — E785 Hyperlipidemia, unspecified: Secondary | ICD-10-CM | POA: Diagnosis not present

## 2017-09-06 DIAGNOSIS — I447 Left bundle-branch block, unspecified: Secondary | ICD-10-CM | POA: Insufficient documentation

## 2017-09-06 DIAGNOSIS — A09 Infectious gastroenteritis and colitis, unspecified: Secondary | ICD-10-CM

## 2017-09-06 DIAGNOSIS — Z8249 Family history of ischemic heart disease and other diseases of the circulatory system: Secondary | ICD-10-CM | POA: Diagnosis not present

## 2017-09-06 DIAGNOSIS — F329 Major depressive disorder, single episode, unspecified: Secondary | ICD-10-CM | POA: Diagnosis not present

## 2017-09-06 DIAGNOSIS — Z882 Allergy status to sulfonamides status: Secondary | ICD-10-CM | POA: Insufficient documentation

## 2017-09-06 DIAGNOSIS — A0472 Enterocolitis due to Clostridium difficile, not specified as recurrent: Secondary | ICD-10-CM

## 2017-09-06 DIAGNOSIS — I700 Atherosclerosis of aorta: Secondary | ICD-10-CM | POA: Diagnosis not present

## 2017-09-06 DIAGNOSIS — E039 Hypothyroidism, unspecified: Secondary | ICD-10-CM | POA: Insufficient documentation

## 2017-09-06 DIAGNOSIS — Z953 Presence of xenogenic heart valve: Secondary | ICD-10-CM | POA: Insufficient documentation

## 2017-09-06 DIAGNOSIS — I2510 Atherosclerotic heart disease of native coronary artery without angina pectoris: Secondary | ICD-10-CM | POA: Insufficient documentation

## 2017-09-06 DIAGNOSIS — Z7982 Long term (current) use of aspirin: Secondary | ICD-10-CM | POA: Diagnosis not present

## 2017-09-06 DIAGNOSIS — R197 Diarrhea, unspecified: Secondary | ICD-10-CM | POA: Diagnosis present

## 2017-09-06 DIAGNOSIS — Z681 Body mass index (BMI) 19 or less, adult: Secondary | ICD-10-CM | POA: Diagnosis not present

## 2017-09-06 DIAGNOSIS — K449 Diaphragmatic hernia without obstruction or gangrene: Secondary | ICD-10-CM | POA: Diagnosis not present

## 2017-09-06 DIAGNOSIS — I10 Essential (primary) hypertension: Secondary | ICD-10-CM | POA: Insufficient documentation

## 2017-09-06 DIAGNOSIS — Z885 Allergy status to narcotic agent status: Secondary | ICD-10-CM | POA: Diagnosis not present

## 2017-09-06 DIAGNOSIS — K5641 Fecal impaction: Secondary | ICD-10-CM | POA: Insufficient documentation

## 2017-09-06 DIAGNOSIS — Z886 Allergy status to analgesic agent status: Secondary | ICD-10-CM | POA: Diagnosis not present

## 2017-09-06 DIAGNOSIS — R159 Full incontinence of feces: Secondary | ICD-10-CM | POA: Diagnosis not present

## 2017-09-06 DIAGNOSIS — E43 Unspecified severe protein-calorie malnutrition: Secondary | ICD-10-CM | POA: Insufficient documentation

## 2017-09-06 DIAGNOSIS — Z79899 Other long term (current) drug therapy: Secondary | ICD-10-CM | POA: Diagnosis not present

## 2017-09-06 LAB — COMPREHENSIVE METABOLIC PANEL
ALT: 16 U/L (ref 0–44)
AST: 27 U/L (ref 15–41)
Albumin: 2.3 g/dL — ABNORMAL LOW (ref 3.5–5.0)
Alkaline Phosphatase: 98 U/L (ref 38–126)
Anion gap: 5 (ref 5–15)
BUN: 10 mg/dL (ref 8–23)
CO2: 26 mmol/L (ref 22–32)
Calcium: 8.1 mg/dL — ABNORMAL LOW (ref 8.9–10.3)
Chloride: 108 mmol/L (ref 98–111)
Creatinine, Ser: 0.62 mg/dL (ref 0.44–1.00)
GFR calc Af Amer: >60 mL/min (ref >60)
Glucose, Bld: 113 mg/dL — ABNORMAL HIGH (ref 70–99)
POTASSIUM: 3.6 mmol/L (ref 3.5–5.1)
SODIUM: 139 mmol/L (ref 135–145)
Total Bilirubin: 1.6 mg/dL — ABNORMAL HIGH (ref 0.3–1.2)
Total Protein: 5.4 g/dL — ABNORMAL LOW (ref 6.5–8.1)

## 2017-09-06 LAB — CBC WITH DIFFERENTIAL/PLATELET
ABS IMMATURE GRANULOCYTES: 0 10*3/uL (ref 0.0–0.1)
BASOS PCT: 1 %
Basophils Absolute: 0 10*3/uL (ref 0.0–0.1)
EOS ABS: 0 10*3/uL (ref 0.0–0.7)
Eosinophils Relative: 0 %
HCT: 35.8 % — ABNORMAL LOW (ref 36.0–46.0)
Hemoglobin: 11.9 g/dL — ABNORMAL LOW (ref 12.0–15.0)
IMMATURE GRANULOCYTES: 1 %
Lymphocytes Relative: 29 %
Lymphs Abs: 1.9 10*3/uL (ref 0.7–4.0)
MCH: 35 pg — ABNORMAL HIGH (ref 26.0–34.0)
MCHC: 33.2 g/dL (ref 30.0–36.0)
MCV: 105.3 fL — AB (ref 78.0–100.0)
MONOS PCT: 16 %
Monocytes Absolute: 1 10*3/uL (ref 0.1–1.0)
NEUTROS ABS: 3.6 10*3/uL (ref 1.7–7.7)
NEUTROS PCT: 53 %
Platelets: 181 10*3/uL (ref 150–400)
RBC: 3.4 MIL/uL — AB (ref 3.87–5.11)
RDW: 13 % (ref 11.5–15.5)
WBC: 6.6 10*3/uL (ref 4.0–10.5)

## 2017-09-06 LAB — MAGNESIUM: MAGNESIUM: 1.7 mg/dL (ref 1.7–2.4)

## 2017-09-06 LAB — C DIFFICILE QUICK SCREEN W PCR REFLEX
C DIFFICILE (CDIFF) TOXIN: NEGATIVE
C DIFFICLE (CDIFF) ANTIGEN: POSITIVE — AB

## 2017-09-06 LAB — CLOSTRIDIUM DIFFICILE BY PCR, REFLEXED: CDIFFPCR: POSITIVE — AB

## 2017-09-06 MED ORDER — VANCOMYCIN 50 MG/ML ORAL SOLUTION: 125.0000 mg | Freq: Two times a day (BID) | ORAL | Status: DC

## 2017-09-06 MED ORDER — METOPROLOL TARTRATE 25 MG PO TABS
25.0000 mg | ORAL_TABLET | Freq: Two times a day (BID) | ORAL | Status: DC
  Administered 2017-09-06 – 2017-09-07 (×2): 25 mg via ORAL
  Filled 2017-09-06 (×2): qty 1

## 2017-09-06 MED ORDER — VANCOMYCIN 50 MG/ML ORAL SOLUTION
125.0000 mg | Freq: Four times a day (QID) | ORAL | Status: DC
  Administered 2017-09-06 – 2017-09-07 (×2): 125 mg via ORAL
  Filled 2017-09-06 (×4): qty 2.5

## 2017-09-06 MED ORDER — IOPAMIDOL (ISOVUE-300) INJECTION 61%
INTRAVENOUS | Status: AC
  Filled 2017-09-06: qty 100

## 2017-09-06 MED ORDER — LEVOTHYROXINE SODIUM 75 MCG PO TABS
75.0000 ug | ORAL_TABLET | Freq: Every day | ORAL | Status: DC
  Administered 2017-09-07: 75 ug via ORAL
  Filled 2017-09-06: qty 1

## 2017-09-06 MED ORDER — ASPIRIN EC 81 MG PO TBEC
81.0000 mg | DELAYED_RELEASE_TABLET | Freq: Every day | ORAL | Status: DC
  Administered 2017-09-07: 81 mg via ORAL
  Filled 2017-09-06: qty 1

## 2017-09-06 MED ORDER — ATORVASTATIN CALCIUM 20 MG PO TABS: 40.0000 mg | ORAL_TABLET | Freq: Every day | ORAL | Status: DC

## 2017-09-06 MED ORDER — SODIUM CHLORIDE 0.9 % IV BOLUS
1000.0000 mL | Freq: Once | INTRAVENOUS | Status: AC
  Administered 2017-09-06: 1000 mL via INTRAVENOUS

## 2017-09-06 MED ORDER — ENOXAPARIN SODIUM 40 MG/0.4ML ~~LOC~~ SOLN
40.0000 mg | SUBCUTANEOUS | Status: DC
  Administered 2017-09-06: 40 mg via SUBCUTANEOUS
  Filled 2017-09-06: qty 0.4

## 2017-09-06 MED ORDER — VANCOMYCIN 50 MG/ML ORAL SOLUTION: 125.0000 mg | ORAL | Status: DC

## 2017-09-06 MED ORDER — IOPAMIDOL (ISOVUE-300) INJECTION 61%
80.0000 mL | Freq: Once | INTRAVENOUS | Status: AC | PRN
  Administered 2017-09-06: 80 mL via INTRAVENOUS

## 2017-09-06 MED ORDER — METOPROLOL TARTRATE 25 MG PO TABS: 50.0000 mg | ORAL_TABLET | Freq: Two times a day (BID) | ORAL | Status: DC

## 2017-09-06 MED ORDER — POLYETHYLENE GLYCOL 3350 17 G PO PACK: 17.0000 g | PACK | Freq: Every day | ORAL | Status: DC | PRN

## 2017-09-06 MED ORDER — VANCOMYCIN 50 MG/ML ORAL SOLUTION: 125.0000 mg | Freq: Every day | ORAL | Status: DC

## 2017-09-06 MED ORDER — METRONIDAZOLE 500 MG PO TABS
500.0000 mg | ORAL_TABLET | Freq: Once | ORAL | Status: AC
  Administered 2017-09-06: 500 mg via ORAL
  Filled 2017-09-06: qty 1

## 2017-09-06 MED ORDER — VANCOMYCIN 50 MG/ML ORAL SOLUTION: 125.0000 mg | Freq: Four times a day (QID) | ORAL | Status: DC

## 2017-09-06 NOTE — ED Notes (Signed)
Family counseled on Enteric Precautions (PPE) and educated on handwashing.

## 2017-09-06 NOTE — ED Triage Notes (Addendum)
Patient arrived from home via EMS due to diarrhea for more than 1 weak and weakness.  EMS gave 552ml NS. Per son diarrhea started on the 16th of this month Patient is A&O X 4.

## 2017-09-06 NOTE — H&P (Signed)
History and Physical   [name] FHL:[id number] DOB: [birth year] DOA: 09/06/2017  PCP: [name], MD  Chief Complaint: diarrhea  HPI: this is an 84 year old woman with medical problems including bioprosthetic aortic valve replacement over 5 years ago, hypertension, hyperlipidemia, chronic left bundle branch block, hypothyroidism presenting with 2 weeks of diarrhea.  History is obtained via chart review, as well as discussion with the son and daughter at the bedside. At baseline, the patient lives with her son, she requires assistance with her ADLs, walks at times with a walker. Requires assistance on account of frailty. Spends most of time watching television. Not a current smoker, does not drink alcohol. In the past, has had problems with constipation.  Son reports intermittent use of MiraLAX, none recently. She reports 2 weeks of watery diarrhea, nonbloody. She has had associated symptoms including decreased by mouth intake. Denies abdominal pain, fevers, prior history of Clostridium difficile infection, the recent antibiotic use, no nausea or vomiting.  She reports recent medication changes included cutting her beta blocker dose to half of prior. In attempt to help with sleep, she was tried on Remeron over this month without success, only took for 7 days.  ED Course: vital signs remarkable for respiratory 24 which is normal eyes, normal blood pressure, normal heart rate. Diagnostics obtained include CBC which reveals mild macrocytic anemia with hemoglobin of 1.9, MCV 105.  CMP with creatinine 0.62, total bilirubin 1.6. Albumin 2.3.  A CT of the abdomen and pelvis obtained which revealed large stool burden to the colon, most pronounced in the rectosigmoid, cannot exclude fecal impaction. Of note, the patient underwent fecal disimpaction by the emergency medicine team which resulted large stool burden that was able to be removed. C. Difficile testing revealed positive antigen, negative  toxin. Was initiated on by mouth vancomycin, as well as given 1 L of isotonic fluid.  Hospital medicine consulted for further management.  Review of Systems: A complete ROS was obtained; pertinent positives negatives are denoted in the HPI. Otherwise, all systems are negative.   Past Medical History:  Diagnosis Date  . A-fib (HCC)    perioperative, no recurrence  . Aortic valve prosthesis present   . Chronic constipation   . History of echocardiogram 2008   intact LVEF, 65% nl AVR fxn, mod bileaflet MV thickening  . Hx of echocardiogram 2015   Echo (04/2013): Focal basal hypertrophy, EF 50-55%, normal wall motion, mild MR, PASP 33 mm Hg, AVR okay (mean gradient 6 mmHg)  . Hyperlipidemia   . Hypertension   . Thyroid disease    hypothyroidism    Social History   Socioeconomic History  . Marital status: Married    Spouse name: Not on file  . Number of children: Not on file  . Years of education: Not on file  . Highest education level: Not on file  Occupational History  . Not on file  Social Needs  . Financial resource strain: Not on file  . Food insecurity:    Worry: Not on file    Inability: Not on file  . Transportation needs:    Medical: Not on file    Non-medical: Not on file  Tobacco Use  . Smoking status: Never Smoker  . Smokeless tobacco: Never Used  Substance and Sexual Activity  . Alcohol use: No  . Drug use: No  . Sexual activity: Not on file  Lifestyle  . Physical activity:    Days per week: Not on file  Minutes per session: Not on file  . Stress: Not on file  Relationships  . Social connections:    Talks on phone: Not on file    Gets together: Not on file    Attends religious service: Not on file    Active member of club or organization: Not on file    Attends meetings of clubs or organizations: Not on file    Relationship status: Not on file  . Intimate partner violence:    Fear of current or ex partner: Not on file    Emotionally abused: Not on  file    Physically abused: Not on file    Forced sexual activity: Not on file  Other Topics Concern  . Not on file  Social History Narrative  . Not on file   Family History  Problem Relation Age of Onset  . Heart disease Mother   . Heart attack Mother   . Hypertension Mother   . Heart disease Father   . Stroke Neg Hx     Physical Exam: Vitals:   09/06/17 1830 09/06/17 1845 09/06/17 1900 09/06/17 1915  BP: 134/72 131/68 129/64 119/69  Pulse: 83 85 84 84  Resp: 14 (!) 24 16 16   Temp:      TempSrc:      SpO2: 100% 100% 98% 98%   General: Appears calm and comfortable, frail, elderly white woman, left sided facial scar present ENT: Grossly normal hearing, MMM. Cardiovascular: RRR. No M/R/G. No LE edema. Sternotomy scar well healed. Respiratory: CTA bilaterally; globally decreased breath sounds. No wheezes or crackles. Normal respiratory effort. Breathing room air. Abdomen: Soft, tender to palpation LLQ; no rebound or guarding. Skin: No rash or induration seen on limited exam. Musculoskeletal: Grossly normal tone BUE/BLE. Appropriate ROM.  Psychiatric: Flat affect Neurologic: Moves all extremities in coordinated fashion.  I have personally reviewed the following labs, culture data, and imaging studies.  Assessment/Plan:  # Diarrhea # Initial non-severe C diff infection, posible Course: on admission, watery constant diarrhea for 2 weeks; evaluation in the ED remarkable for large stool burden (appreciated clinically and via CT scan) - large stool burden removed with dis-impaction. Clinically, with some LLQ tenderness on exam. A/P: although most likely case is that her clinical picture fits most with fecal impaction and associated diarrhea (liquid around the obstruction); with presence of abdominal pain, watery diarrhea, and positive C diff antigen - cannot rule out definitively significant C diff contribution. Therefore, will continue with 10 day course of po vancomycin treatment  and continue as needed poly glycol for bowel regimen. I discussed importance of at least once daily BMs. PO intake trial.  #Other problems: -Porcine AVR and HTN: continue home BB -Hypothyroidism: continue home thyroid hormone treatment, checking TSH with AM labs -Macrocytic anemia: B12 check -Low albumin: suspect related to malnutrition  DVT prophylaxis: Subq Lovenox Code Status: Full code upon discussion with the patient and children on admission Disposition Plan: Anticipate D/C home as early as tomorrow as long as she can tolerate PO food and drink and adequate BM frequency Consults called: none Admission status:  Admit to hospital medicine service, floor level of care   Cheri Rous, MD [facility] Page:[phone]  If 7PM-7AM, please contact night-coverage [email or web address] Password [email or web address]

## 2017-09-06 NOTE — ED Provider Notes (Signed)
[facility] EMERGENCY DEPARTMENT Provider Note   CSN: [id number] Arrival date & time: 09/06/17  1651     History   Chief Complaint Chief Complaint  Patient presents with  . Diarrhea  . Weakness    HPI [name] is a 84 y.o. female.  HPI [name] is a 84 y.o. female with history of A. fib, aortic valve prosthesis, chronic constipation, hypertension, thyroid disease, presents to emergency department complaining of diarrhea.  Patient is coming from home.  Patient diarrhea started approximately 2 weeks ago.  Patient lives with her son who takes care of her.  She is at baseline nonambulatory.  Son states that he had a GI bug about 2 weeks ago when her symptoms started.  Initially they thought this may be just a virus or may be she was constipated, however the symptoms have gradually gotten worse.  In the last several days, diarrhea is more frequent.  Patient also was started on a depression medication shortly prior to onset of diarrhea.  She took the medication for about a week, but it was not helping her with her anxiety so the son stopped it.  Her last dose was 8 days ago.  She did not taper down but only was taking it for 7 days prior.  Patient denies any pain.  Specifically no abdominal pain.  She states she is having pain in her rectum.  She denies any fever or chills.  She denies any vomiting.  She has had decreased p.o. intake.  No recent antibiotics or travel.  Past Medical History:  Diagnosis Date  . A-fib (HCC)    perioperative, no recurrence  . Aortic valve prosthesis present   . Chronic constipation   . History of echocardiogram 2008   intact LVEF, 65% nl AVR fxn, mod bileaflet MV thickening  . Hx of echocardiogram 2015   Echo (04/2013): Focal basal hypertrophy, EF 50-55%, normal wall motion, mild MR, PASP 33 mm Hg, AVR okay (mean gradient 6 mmHg)  . Hyperlipidemia   . Hypertension   . Thyroid disease    hypothyroidism     Patient Active  Problem List   Diagnosis Date Noted  . LBBB (left bundle branch block) 05/16/2016  . Weight loss 04/20/2014  . Diverticulosis of colon with hemorrhage 08/27/2013  . Stage I pressure ulcer of sacral region 08/25/2013  . Hypothyroid 08/25/2013  . Rectal bleeding 08/24/2013  . Aortic valve disorder 04/26/2013  . Essential hypertension, benign 03/04/2013  . Hyperlipidemia 03/04/2013  . Heart valve replaced by other means 03/04/2013    Past Surgical History:  Procedure Laterality Date  . COLONOSCOPY N/A 08/26/2013   Procedure: COLONOSCOPY;  Surgeon: Beryle Beams, MD;  Location: [facility];  Service: Endoscopy;  Laterality: N/A;     OB History   None      Home Medications    Prior to Admission medications   Medication Sig Start Date End Date Taking? Authorizing Provider  aspirin 81 MG tablet Take 81 mg by mouth daily.    [provider]  atorvastatin (LIPITOR) 40 MG tablet Take 1 tablet (40 mg total) by mouth daily at 6 PM. 04/24/14   Kathlen Mody, Nicki Reaper T, PA-C  augmented betamethasone dipropionate (DIPROLENE-AF) 0.05 % ointment Apply 1 application topically daily as needed. For eczema    [provider]  levothyroxine (SYNTHROID, LEVOTHROID) 75 MCG tablet Take 75 mcg by mouth daily before breakfast. 04/21/17   [provider]  metoprolol (LOPRESSOR) 50  MG tablet Take 50 mg by mouth 2 (two) times daily.    [provider]  polyethylene glycol (MIRALAX / GLYCOLAX) packet Take 17 g by mouth daily as needed for mild constipation, moderate constipation or severe constipation.     [provider]    Family History Family History  Problem Relation Age of Onset  . Heart disease Mother   . Heart attack Mother   . Hypertension Mother   . Heart disease Father   . Stroke Neg Hx     Social History Social History   Tobacco Use  . Smoking status: Never Smoker  . Smokeless tobacco: Never Used  Substance Use Topics  . Alcohol use: No  . Drug  use: No     Allergies   Bactrim [sulfamethoxazole-trimethoprim]; Celebrex [celecoxib]; and Codeine   Review of Systems Review of Systems  Constitutional: Positive for appetite change. Negative for chills and fever.  Respiratory: Negative for cough, chest tightness and shortness of breath.   Cardiovascular: Negative for chest pain, palpitations and leg swelling.  Gastrointestinal: Positive for diarrhea. Negative for abdominal pain and vomiting.  Genitourinary: Negative for dysuria, flank pain, pelvic pain, vaginal bleeding, vaginal discharge and vaginal pain.  Musculoskeletal: Negative for arthralgias, myalgias, neck pain and neck stiffness.  Skin: Negative for rash.  Neurological: Negative for dizziness, weakness and headaches.  All other systems reviewed and are negative.    Physical Exam Updated Vital Signs Temp 98.1 F (36.7 C) (Oral)   SpO2 98%   Physical Exam  Constitutional: She appears well-developed and well-nourished. No distress.  HENT:  Head: Normocephalic.  Oral mucosa dry  Eyes: Conjunctivae are normal.  Neck: Neck supple.  Cardiovascular: Normal rate, regular rhythm and normal heart sounds.  Pulmonary/Chest: Effort normal and breath sounds normal. No respiratory distress. She has no wheezes. She has no rales.  Abdominal: Soft. Bowel sounds are normal. She exhibits no distension. There is no tenderness. There is no rebound.  Musculoskeletal: She exhibits no edema.  Neurological: She is alert.  Skin: Skin is warm and dry.  Psychiatric: She has a normal mood and affect. Her behavior is normal.  Nursing note and vitals reviewed.    ED Treatments / Results  Labs (all labs ordered are listed, but only abnormal results are displayed) Labs Reviewed - No data to display  EKG None  Radiology No results found.  Procedures Fecal disimpaction Date/Time: 09/06/2017 9:10 PM Performed by: Jeannett Senior, PA-C Authorized by: Jeannett Senior, PA-C    Consent: Verbal consent obtained. Consent given by: patient Patient understanding: patient states understanding of the procedure being performed Imaging studies: imaging studies available Patient identity confirmed: verbally with patient Time out: Immediately prior to procedure a "time out" was called to verify the correct patient, procedure, equipment, support staff and site/side marked as required. Local anesthesia used: no  Anesthesia: Local anesthesia used: no  Sedation: Patient sedated: no  Patient tolerance: Patient tolerated the procedure well with no immediate complications Comments: Disimpacted large amount of firm light brown stool    (including critical care time)  Medications Ordered in ED Medications  sodium chloride 0.9 % bolus 1,000 mL (has no administration in time range)     Initial Impression / Assessment and Plan / ED Course  I have reviewed the triage vital signs and the nursing notes.  Pertinent labs & imaging results that were available during my care of the patient were reviewed by me and considered in my medical decision making (see chart  for details).     Patient with diarrhea for 2 weeks, worsening.  No abdominal pain.  No vomiting.  No fever or chills.  She does appear to be dry.  Will check labs, specifically blood counts and electrolytes.  Will check urinalysis.  We will try to get stool cultures and check for C. difficile.  Son is requesting imaging, I think is reasonable to CT abdomen pelvis to rule out colitis.  7:45 PM cdiff antigen positive, negative toxin.  CT scan pending. Electrolytes OK.   8:48 PM CT scan showing possible fecal impaction.  I was able to disimpact large amount of stool from patient's rectum.  She feels better.  We will start on treatment for C. difficile.  Given patient's weakness, continues diarrhea, will admit.  9:11 PM Spoke with hospitalist. Will admit. VS normal. Plan discussed with pt and her family who agree.    Vitals:   09/06/17 1830 09/06/17 1845 09/06/17 1900 09/06/17 1915  BP: 134/72 131/68 129/64 119/69  Pulse: 83 85 84 84  Resp: 14 (!) 24 16 16   Temp:      TempSrc:      SpO2: 100% 100% 98% 98%     Final Clinical Impressions(s) / ED Diagnoses   Final diagnoses:  Diarrhea of infectious origin  C. difficile diarrhea  Fecal impaction [facility])    ED Discharge Orders    None       Janee Morn 09/06/17 2111    Lajean Saver, MD 09/06/17 2259

## 2017-09-07 ENCOUNTER — Encounter (HOSPITAL_COMMUNITY): Payer: Self-pay

## 2017-09-07 ENCOUNTER — Other Ambulatory Visit: Payer: Self-pay

## 2017-09-07 DIAGNOSIS — K5641 Fecal impaction: Secondary | ICD-10-CM

## 2017-09-07 DIAGNOSIS — E039 Hypothyroidism, unspecified: Secondary | ICD-10-CM | POA: Diagnosis not present

## 2017-09-07 LAB — BASIC METABOLIC PANEL
Anion gap: 3 — ABNORMAL LOW (ref 5–15)
BUN: 9 mg/dL (ref 8–23)
CALCIUM: 7.3 mg/dL — AB (ref 8.9–10.3)
CO2: 24 mmol/L (ref 22–32)
Chloride: 111 mmol/L (ref 98–111)
Creatinine, Ser: 0.51 mg/dL (ref 0.44–1.00)
GFR calc Af Amer: >60 mL/min (ref >60)
GLUCOSE: 87 mg/dL (ref 70–99)
Potassium: 3.5 mmol/L (ref 3.5–5.1)
SODIUM: 138 mmol/L (ref 135–145)

## 2017-09-07 LAB — VITAMIN B12: VITAMIN B 12: 739 pg/mL (ref 180–914)

## 2017-09-07 LAB — TSH: TSH: 0.238 u[IU]/mL — AB (ref 0.350–4.500)

## 2017-09-07 LAB — URINALYSIS, ROUTINE W REFLEX MICROSCOPIC
BILIRUBIN URINE: NEGATIVE
GLUCOSE, UA: NEGATIVE mg/dL
HGB URINE DIPSTICK: NEGATIVE
Ketones, ur: NEGATIVE mg/dL
Leukocytes, UA: NEGATIVE
Nitrite: NEGATIVE
PROTEIN: NEGATIVE mg/dL
Specific Gravity, Urine: 1.034 — ABNORMAL HIGH (ref 1.005–1.030)
pH: 6 (ref 5.0–8.0)

## 2017-09-07 LAB — CBC
HCT: 31.1 % — ABNORMAL LOW (ref 36.0–46.0)
Hemoglobin: 10.3 g/dL — ABNORMAL LOW (ref 12.0–15.0)
MCH: 34.8 pg — ABNORMAL HIGH (ref 26.0–34.0)
MCHC: 33.1 g/dL (ref 30.0–36.0)
MCV: 105.1 fL — ABNORMAL HIGH (ref 78.0–100.0)
PLATELETS: 173 10*3/uL (ref 150–400)
RBC: 2.96 MIL/uL — ABNORMAL LOW (ref 3.87–5.11)
RDW: 13.1 % (ref 11.5–15.5)
WBC: 4.7 10*3/uL (ref 4.0–10.5)

## 2017-09-07 MED ORDER — ADULT MULTIVITAMIN W/MINERALS CH
1.0000 | ORAL_TABLET | Freq: Every day | ORAL | Status: DC
  Administered 2017-09-07: 1 via ORAL
  Filled 2017-09-07: qty 1

## 2017-09-07 MED ORDER — BOOST / RESOURCE BREEZE PO LIQD CUSTOM
1.0000 | Freq: Three times a day (TID) | ORAL | Status: DC
  Administered 2017-09-07: 1 via ORAL

## 2017-09-07 MED ORDER — SENNA 8.6 MG PO TABS: 2.0000 | ORAL_TABLET | Freq: Every evening | ORAL | 0 refills | Status: DC | PRN

## 2017-09-07 MED ORDER — POLYETHYLENE GLYCOL 3350 17 G PO PACK: 17.0000 g | PACK | Freq: Every day | ORAL | 0 refills | Status: DC

## 2017-09-07 MED ORDER — LEVOTHYROXINE SODIUM 50 MCG PO TABS: 50.0000 ug | ORAL_TABLET | Freq: Every day | ORAL | 0 refills | Status: AC

## 2017-09-07 NOTE — Progress Notes (Signed)
Patient given discharge instructions, patient's son also made aware of medication changes and follow up appointments. Patient left unit in stable condition via wheelchair with son and nursing staff.

## 2017-09-07 NOTE — Evaluation (Addendum)
Physical Therapy Evaluation Patient Details Name: [name] MRN: [id number] DOB: 1929-12-18 Today's Date: 09/07/2017   History of Present Illness  Pt is an 84 y/o female admitted secondary to diarrhea. CT of abdomen revealed large stool burden in colon. PMH includes s/p AVR, HTN and a fib.   Clinical Impression  Pt admitted secondary to problem above with deficits below. Pt presenting with weakness and decreased balance and required min to mod A to transfer to chair this session. Pt's son reports he has been assisting pt with mobility and daughter has been assisting with ADLs.  Educated about use of WC, however, son reports the house is not WC accessible, so is currently refusing. Will continue to follow acutely to maximize functional mobility independence and safety.   Addendum: Educated about SNF, however, son initially declining during session. Spoke with case management, and son interested in [facility] in afternoon. Feel pt would benefit given current mobility deficits to increase independence and safety with mobility prior to d/c home.    Follow Up Recommendations SNF;Supervision/Assistance - 24 hour    Equipment Recommendations  None recommended by PT(Refusing WC )    Recommendations for Other Services OT consult     Precautions / Restrictions Precautions Precautions: Fall Restrictions Weight Bearing Restrictions: No      Mobility  Bed Mobility Overal bed mobility: Needs Assistance Bed Mobility: Supine to Sit     Supine to sit: Mod assist     General bed mobility comments: Mod A for trunk elevation and to scoot hips to EOB.   Transfers Overall transfer level: Needs assistance Equipment used: 1 person hand held assist Transfers: Sit to/from [facility] Sit to Stand: Mod assist Stand pivot transfers: Min assist       General transfer comment: Mod A for lift assist and steadying assist with PT standing in front of pt to assist with transfer.  Min A for steadying and cues for sequencing to perform stand pivot to chair. Pt refusing further mobility.   Ambulation/Gait                Stairs            Wheelchair Mobility    Modified Rankin (Stroke Patients Only)       Balance Overall balance assessment: Needs assistance Sitting-balance support: No upper extremity supported;Feet supported Sitting balance-Leahy Scale: Fair     Standing balance support: Bilateral upper extremity supported;During functional activity Standing balance-Leahy Scale: Poor Standing balance comment: Reliant on BUE support                              Pertinent Vitals/Pain Pain Assessment: No/denies pain    Home Living Family/patient expects to be discharged to:: Private residence Living Arrangements: Children Available Help at Discharge: Family;Available 24 hours/day Type of Home: House Home Access: Stairs to enter Entrance Stairs-Rails: None Entrance Stairs-Number of Steps: 3 Home Layout: One level Home Equipment: Walker - 2 wheels;Shower seat      Prior Function Level of Independence: Needs assistance   Gait / Transfers Assistance Needed: Pt and son reports son helps with ambulation, transfers, and stair management.   ADL's / Homemaking Assistance Needed: Pt reports daughter assists with bathing/dressing.         Hand Dominance        Extremity/Trunk Assessment   Upper Extremity Assessment Upper Extremity Assessment: Generalized weakness    Lower Extremity Assessment Lower Extremity Assessment:  Generalized weakness    Cervical / Trunk Assessment Cervical / Trunk Assessment: Kyphotic  Communication   Communication: No difficulties  Cognition Arousal/Alertness: Awake/alert Behavior During Therapy: WFL for tasks assessed/performed Overall Cognitive Status: Within Functional Limits for tasks assessed                                        General Comments General comments  (skin integrity, edema, etc.): Pt's son present at end of session. Reports he has been assisting pt at home and plans to continue to do so. Currently refusing SNF.     Exercises     Assessment/Plan    PT Assessment Patient needs continued PT services  PT Problem List Decreased strength;Decreased balance;Decreased activity tolerance;Decreased mobility;Decreased knowledge of use of DME       PT Treatment Interventions Gait training;DME instruction;Therapeutic activities;Stair training;Functional mobility training;Therapeutic exercise;Balance training;Patient/family education    PT Goals (Current goals can be found in the Care Plan section)  Acute Rehab PT Goals Patient Stated Goal: for pt to return home per son  PT Goal Formulation: With patient/family Time For Goal Achievement: 09/21/17 Potential to Achieve Goals: Fair    Frequency Min 3X/week   Barriers to discharge        Co-evaluation               AM-PAC PT "6 Clicks" Daily Activity  Outcome Measure Difficulty turning over in bed (including adjusting bedclothes, sheets and blankets)?: Unable Difficulty moving from lying on back to sitting on the side of the bed? : Unable Difficulty sitting down on and standing up from a chair with arms (e.g., wheelchair, bedside commode, etc,.)?: Unable Help needed moving to and from a bed to chair (including a wheelchair)?: A Little Help needed walking in hospital room?: A Lot Help needed climbing 3-5 steps with a railing? : A Lot 6 Click Score: 10    End of Session Equipment Utilized During Treatment: Gait belt Activity Tolerance: Patient tolerated treatment well Patient left: in chair;with call bell/phone within reach;with chair alarm set Nurse Communication: Mobility status;Other (comment)(pt with BM ) PT Visit Diagnosis: Unsteadiness on feet (R26.81);Muscle weakness (generalized) (M62.81)    Time: 4696-2952 PT Time Calculation (min) (ACUTE ONLY): 22 min   Charges:   PT  Evaluation $PT Eval Moderate Complexity: 1 Mod          Leighton Ruff, PT, DPT  Acute Rehabilitation Services  Pager: [phone]   Rudean Hitt 09/07/2017, 12:15 PM

## 2017-09-07 NOTE — NC FL2 (Signed)
[place] LEVEL OF CARE SCREENING TOOL     IDENTIFICATION  Patient Name: [name] Birthdate: Dec 10, 1929 Sex: female Admission Date (Current Location): 09/06/2017  [facility] and [place] Number:  Herbalist and Address:   [facility], [address] [place], [place], [address]      Provider Number: [id number]  Attending Physician Name and Address:  Jonetta Osgood, MD  Relative Name and Phone Number:  Octavia Bruckner, son, [phone]    Current Level of Care: Hospital Recommended Level of Care: [place] Prior Approval Number:    Date Approved/Denied:   PASRR Number: [id number]  Discharge Plan: SNF    Current Diagnoses: Patient Active Problem List   Diagnosis Date Noted  . Fecal impaction ([place])   . Diarrhea 09/06/2017  . LBBB (left bundle branch block) 05/16/2016  . Weight loss 04/20/2014  . Diverticulosis of colon with hemorrhage 08/27/2013  . Stage I pressure ulcer of sacral region 08/25/2013  . Hypothyroid 08/25/2013  . Rectal bleeding 08/24/2013  . Aortic valve disorder 04/26/2013  . Essential hypertension, benign 03/04/2013  . Hyperlipidemia 03/04/2013  . Heart valve replaced by other means 03/04/2013    Orientation RESPIRATION BLADDER Height & Weight     Self, Time, Situation, Place  Normal Incontinent, External catheter Weight: 95 lb 7.4 oz (43.3 kg) Height:  5\' 5"  (165.1 cm)  BEHAVIORAL SYMPTOMS/MOOD NEUROLOGICAL BOWEL NUTRITION STATUS      Incontinent Diet(see discharge summary)  AMBULATORY STATUS COMMUNICATION OF NEEDS Skin   Extensive Assist Verbally PU Stage and Appropriate Care PU Stage 1 Dressing: (foam dressing on sacrum)                     Personal Care Assistance Level of Assistance  Bathing, Feeding, Dressing Bathing Assistance: Maximum assistance Feeding assistance: Independent Dressing Assistance: Maximum assistance     Functional Limitations Info  Sight, Hearing, Speech  Sight Info: Adequate Hearing Info: Adequate Speech Info: Adequate    SPECIAL CARE FACTORS FREQUENCY  PT (By licensed PT), OT (By licensed OT)     PT Frequency: 5x week OT Frequency: 5x week            Contractures Contractures Info: Not present    Additional Factors Info  Code Status, Allergies, Isolation Precautions Code Status Info: Full Code Allergies Info: BACTRIM SULFAMETHOXAZOLE-TRIMETHOPRIM, CELEBREX CELECOXIB, CODEINE      Isolation Precautions Info: Enteric     Current Medications (09/07/2017):  This is the current hospital active medication list Current Facility-Administered Medications  Medication Dose Route Frequency Provider Last Rate Last Dose  . aspirin EC tablet 81 mg  81 mg Oral Daily Vilma Prader, MD   81 mg at 09/07/17 0857  . atorvastatin (LIPITOR) tablet 40 mg  40 mg Oral q1800 Vilma Prader, MD      . enoxaparin (LOVENOX) injection 40 mg  40 mg Subcutaneous Q24H Vilma Prader, MD   40 mg at 09/06/17 2324  . feeding supplement (BOOST / RESOURCE BREEZE) liquid 1 Container  1 Container Oral TID BM Jonetta Osgood, MD   1 Container at 09/07/17 1517  . levothyroxine (SYNTHROID, LEVOTHROID) tablet 75 mcg  75 mcg Oral QAC breakfast Vilma Prader, MD   75 mcg at 09/07/17 [phone]  . metoprolol tartrate (LOPRESSOR) tablet 25 mg  25 mg Oral BID Vilma Prader, MD   25 mg at 09/07/17 0858  . multivitamin with minerals tablet 1 tablet  1 tablet Oral Daily Jonetta Osgood, MD   1 tablet at 09/07/17 1517  . polyethylene glycol (MIRALAX / GLYCOLAX) packet 17 g  17 g Oral Daily PRN Vilma Prader, MD         Discharge Medications: Please see discharge summary for a list of discharge medications.  Relevant Imaging Results:  Relevant Lab Results:   Additional Information SS# [name], [place]

## 2017-09-07 NOTE — Care Management (Signed)
Discussed Vancomycin co pay discussed with patient's son. He voiced understanding and can afford.   Magdalen Spatz RN BSN [phone]

## 2017-09-07 NOTE — Discharge Summary (Signed)
PATIENT DETAILS Name: [name] Age: 84 y.o. Sex: female Date of Birth: Aug 18, 1929 MRN: [id number]. Admitting Physician: Vilma Prader, MD OIN:OMVEHM, [name], MD  Admit Date: 09/06/2017 Discharge date: 09/07/2017  Recommendations for Outpatient Follow-up:  1. Follow up with PCP in 1-2 weeks 2. Please obtain BMP/CBC in one week 3. TSH is slightly suppressed-not sure when Synthroid dosing was last adjusted-decreased Synthroid to 50 mcg.  Recheck TSH in 6 weeks.   Admitted From:  Home  Disposition: Home (patient's son who I spoke to personally-refused SNF and home health services)   Home Health: No  Equipment/Devices: None  Discharge Condition: Stable  CODE STATUS: FULL CODE  Diet recommendation:  Heart Healthy   Brief Summary: See H&P, Labs, Consult and Test reports for all details in brief, patient is a 84 year old female with history of hypertension, dyslipidemia, hypothyroidism, bioprosthetic aortic valve replacement, long-standing history of constipation-presented with fecal incontinence, and loose stools for approximately for last 10 days.  Patient was found to have fecal impaction-was disimpacted in the emergency room, C. difficile PCR was positive (likely colonization)-patient was subsequently admitted to the hospitalist service.  See below for further details.   Brief Hospital Course: Diarrhea with fecal incontinence: High suspicion that this was secondary to fecal impaction-rather than C. difficile colitis.  Patient was disimpacted to the emergency room-her diarrhea has essentially resolved-nurse reports one slightly loose stools earlier this morning.  Patient does not have fever, no leukocytosis, no evidence of colitis on CT scan abdomen, her last antimicrobial use per patient's son was approximately 1 year back.  C. difficile PCR and stool was likely a colonization rather than a true infection (toxin negative).  Since his symptoms have essentially  resolved-following disimpaction-stop oral vancomycin-have spoken with the patient's son over the phone-he will restart MiraLAX that the patient stop taking a few weeks back.  Have advised that if the patient still is constipated in spite of MiraLAX-to add Senokot.  The unlikely event that the patient has recurrent diarrhea, fever, or develops signs of dehydration-patient's son was advised to bring the patient back to the emergency room.  Hypothyroidism: TSH slightly suppressed-have decreased Synthroid to 50 mcg-recheck TSH in 6 weeks.  Severe malnutrition: Continue feeding supplement as an outpatient.  Rest of the patient's medical comorbidities were stable during the short overnight hospital stay.  Procedures/Studies: None  Discharge Diagnoses:  Active Problems:   Diarrhea  Discharge Instructions:  Activity:  As tolerated with Full fall precautions use walker/cane & assistance as needed   Discharge Instructions    Discharge instructions   Complete by:  As directed    Follow with Primary MD  [name], MD in 1 week  If dairrhea occurs, or if you fever or signs of dehydration-please seek immediate medical attention  Your TSH was slightly suppressed, hence Synthroid dosing was reduced to 50 mcg.  Please ask your primary care practitioner to repeat TSH in 6 weeks.   Please get a complete blood count and chemistry panel checked by your Primary MD at your next visit, and again as instructed by your Primary MD.  Get Medicines reviewed and adjusted: Please take all your medications with you for your next visit with your Primary MD  Laboratory/radiological data: Please request your Primary MD to go over all hospital tests and procedure/radiological results at the follow up, please ask your Primary MD to get all Hospital records sent to his/her office.  In some cases, they will be blood work, cultures and biopsy  results pending at the time of your discharge. Please request that  your primary care M.D. follows up on these results.  Also Note the following: If you experience worsening of your admission symptoms, develop shortness of breath, life threatening emergency, suicidal or homicidal thoughts you must seek medical attention immediately by calling 911 or calling your MD immediately  if symptoms less severe.  You must read complete instructions/literature along with all the possible adverse reactions/side effects for all the Medicines you take and that have been prescribed to you. Take any new Medicines after you have completely understood and accpet all the possible adverse reactions/side effects.   Do not drive when taking Pain medications or sleeping medications (Benzodaizepines)  Do not take more than prescribed Pain, Sleep and Anxiety Medications. It is not advisable to combine anxiety,sleep and pain medications without talking with your primary care practitioner  Special Instructions: If you have smoked or chewed Tobacco  in the last 2 yrs please stop smoking, stop any regular Alcohol  and or any Recreational drug use.  Wear Seat belts while driving.  Please note: You were cared for by a hospitalist during your hospital stay. Once you are discharged, your primary care physician will handle any further medical issues. Please note that NO REFILLS for any discharge medications will be authorized once you are discharged, as it is imperative that you return to your primary care physician (or establish a relationship with a primary care physician if you do not have one) for your post hospital discharge needs so that they can reassess your need for medications and monitor your lab values.     Allergies as of 09/07/2017      Reactions   Bactrim [sulfamethoxazole-trimethoprim] Other (See Comments)   Unknown reaction   Celebrex [celecoxib] Other (See Comments)   Unknown reaction   Codeine Nausea And Vomiting      Medication List    TAKE these medications     aspirin EC 81 MG tablet Take 81 mg by mouth daily.   atorvastatin 40 MG tablet Commonly known as:  LIPITOR Take 1 tablet (40 mg total) by mouth daily at 6 PM. What changed:  when to take this   augmented betamethasone dipropionate 0.05 % ointment Commonly known as:  DIPROLENE-AF Apply 1 application topically daily as needed (eczema).   levothyroxine 50 MCG tablet Commonly known as:  SYNTHROID, LEVOTHROID Take 1 tablet (50 mcg total) by mouth daily. What changed:    medication strength  how much to take   metoprolol tartrate 50 MG tablet Commonly known as:  LOPRESSOR Take 25 mg by mouth 2 (two) times daily.   polyethylene glycol packet Commonly known as:  MIRALAX / GLYCOLAX Take 17 g by mouth daily. What changed:    when to take this  reasons to take this   senna 8.6 MG Tabs tablet Commonly known as:  SENOKOT Take 2 tablets (17.2 mg total) by mouth at bedtime as needed for mild constipation (if no BM inspite of Miralax).       Allergies  Allergen Reactions  . Bactrim [Sulfamethoxazole-Trimethoprim] Other (See Comments)    Unknown reaction  . Celebrex [Celecoxib] Other (See Comments)    Unknown reaction  . Codeine Nausea And Vomiting    Consultations:   None  Other Procedures/Studies: Ct Abdomen Pelvis W Contrast  Result Date: 09/06/2017 CLINICAL DATA:  Diarrhea EXAM: CT ABDOMEN AND PELVIS WITH CONTRAST TECHNIQUE: Multidetector CT imaging of the abdomen and pelvis was performed using the  standard protocol following bolus administration of intravenous contrast. CONTRAST:  <See Chart> ISOVUE-300 IOPAMIDOL (ISOVUE-300) INJECTION 61% COMPARISON:  Ultrasound 10/16/2014 FINDINGS: Lower chest: Densely calcified mitral valve and visualized coronary vessels. Heart is upper limits normal in size. Small to moderate hiatal hernia. Right base atelectasis. No effusions. Hepatobiliary: There is intrahepatic and extrahepatic biliary ductal dilatation. Common bile duct best  seen on delayed renal images due to respiratory motion on initial portal venous phase imaging. Common bile duct measures up to 16 mm. There is pneumobilia. Prior cholecystectomy. No visible focal hepatic abnormality. Pancreas: No focal abnormality or ductal dilatation. Spleen: No focal abnormality.  Normal size. Adrenals/Urinary Tract: No adrenal abnormality. No focal renal abnormality. No stones or hydronephrosis. Urinary bladder is unremarkable. Stomach/Bowel: Large stool burden in the colon, particularly rectosigmoid colon. Cannot exclude fetal impaction. No evidence of bowel obstruction. Appendix not definitively seen. Vascular/Lymphatic: Diffuse aortic, iliac and branch vessel calcifications. No evidence of aneurysm or adenopathy. Reproductive: Uterus and adnexa unremarkable.  No mass. Other: No free fluid or free air. Musculoskeletal: No acute bony abnormality. Scoliosis and diffuse degenerative changes in the lumbar spine. IMPRESSION: Large stool burden throughout the colon, most pronounced in the rectosigmoid colon. Cannot exclude fecal impaction. Intrahepatic and extrahepatic biliary ductal dilatation and pneumobilia. Common bile duct measures up to 16 mm. Prior cholecystectomy. Recommend clinical correlation with LFTs. There was ductal dilatation and pneumobilia noted on prior ultrasound. Moderate-sized hiatal hernia. Coronary artery disease, aortic atherosclerosis. Electronically Signed   By: Rolm Baptise M.D.   On: 09/06/2017 20:16     TODAY-DAY OF DISCHARGE:  Subjective:   Grenda Ariel today has no headache,no chest abdominal pain,no new weakness tingling or numbness, feels much better wants to go home today.  Objective:   Blood pressure 137/64, pulse 77, temperature (!) 97.3 F (36.3 C), temperature source Axillary, resp. rate 16, height 5\' 5"  (1.651 m), weight 43.3 kg, SpO2 100 %.  Intake/Output Summary (Last 24 hours) at 09/07/2017 1508 Last data filed at 09/07/2017 1013 Gross per 24  hour  Intake 240 ml  Output 325 ml  Net -85 ml   Filed Weights   09/06/17 2254  Weight: 43.3 kg    Exam: Awake Alert, Oriented *3, No new F.N deficits, Normal affect [place].AT,PERRAL Supple Neck,No JVD, No cervical lymphadenopathy appriciated.  Symmetrical Chest wall movement, Good air movement bilaterally, CTAB RRR,No Gallops,Rubs or new Murmurs, No Parasternal Heave +ve B.Sounds, Abd Soft, Non tender, No organomegaly appriciated, No rebound -guarding or rigidity. No Cyanosis, Clubbing or edema, No new Rash or bruise   PERTINENT RADIOLOGIC STUDIES: Ct Abdomen Pelvis W Contrast  Result Date: 09/06/2017 CLINICAL DATA:  Diarrhea EXAM: CT ABDOMEN AND PELVIS WITH CONTRAST TECHNIQUE: Multidetector CT imaging of the abdomen and pelvis was performed using the standard protocol following bolus administration of intravenous contrast. CONTRAST:  <See Chart> ISOVUE-300 IOPAMIDOL (ISOVUE-300) INJECTION 61% COMPARISON:  Ultrasound 10/16/2014 FINDINGS: Lower chest: Densely calcified mitral valve and visualized coronary vessels. Heart is upper limits normal in size. Small to moderate hiatal hernia. Right base atelectasis. No effusions. Hepatobiliary: There is intrahepatic and extrahepatic biliary ductal dilatation. Common bile duct best seen on delayed renal images due to respiratory motion on initial portal venous phase imaging. Common bile duct measures up to 16 mm. There is pneumobilia. Prior cholecystectomy. No visible focal hepatic abnormality. Pancreas: No focal abnormality or ductal dilatation. Spleen: No focal abnormality.  Normal size. Adrenals/Urinary Tract: No adrenal abnormality. No focal renal abnormality. No stones or hydronephrosis. Urinary bladder  is unremarkable. Stomach/Bowel: Large stool burden in the colon, particularly rectosigmoid colon. Cannot exclude fetal impaction. No evidence of bowel obstruction. Appendix not definitively seen. Vascular/Lymphatic: Diffuse aortic, iliac and branch  vessel calcifications. No evidence of aneurysm or adenopathy. Reproductive: Uterus and adnexa unremarkable.  No mass. Other: No free fluid or free air. Musculoskeletal: No acute bony abnormality. Scoliosis and diffuse degenerative changes in the lumbar spine. IMPRESSION: Large stool burden throughout the colon, most pronounced in the rectosigmoid colon. Cannot exclude fecal impaction. Intrahepatic and extrahepatic biliary ductal dilatation and pneumobilia. Common bile duct measures up to 16 mm. Prior cholecystectomy. Recommend clinical correlation with LFTs. There was ductal dilatation and pneumobilia noted on prior ultrasound. Moderate-sized hiatal hernia. Coronary artery disease, aortic atherosclerosis. Electronically Signed   By: Rolm Baptise M.D.   On: 09/06/2017 20:16     PERTINENT LAB RESULTS: CBC: Recent Labs    09/06/17 1822 09/07/17 0522  WBC 6.6 4.7  HGB 11.9* 10.3*  HCT 35.8* 31.1*  PLT 181 173   CMET CMP     Component Value Date/Time   NA 138 09/07/2017 0522   K 3.5 09/07/2017 0522   CL 111 09/07/2017 0522   CO2 24 09/07/2017 0522   GLUCOSE 87 09/07/2017 0522   BUN 9 09/07/2017 0522   CREATININE 0.51 09/07/2017 0522   CALCIUM 7.3 (L) 09/07/2017 0522   PROT 5.4 (L) 09/06/2017 1822   ALBUMIN 2.3 (L) 09/06/2017 1822   AST 27 09/06/2017 1822   ALT 16 09/06/2017 1822   ALKPHOS 98 09/06/2017 1822   BILITOT 1.6 (H) 09/06/2017 1822   GFRNONAA >60 09/07/2017 0522   GFRAA >60 09/07/2017 0522    GFR Estimated Creatinine Clearance: 34.5 mL/min (by C-G formula based on SCr of 0.51 mg/dL). No results for input(s): LIPASE, AMYLASE in the last 72 hours. No results for input(s): CKTOTAL, CKMB, CKMBINDEX, TROPONINI in the last 72 hours. Invalid input(s): POCBNP No results for input(s): DDIMER in the last 72 hours. No results for input(s): HGBA1C in the last 72 hours. No results for input(s): CHOL, HDL, LDLCALC, TRIG, CHOLHDL, LDLDIRECT in the last 72 hours. Recent Labs     09/07/17 0522  TSH 0.238*   Recent Labs    09/07/17 0522  VITAMINB12 739   Coags: No results for input(s): INR in the last 72 hours.  Invalid input(s): PT Microbiology: Recent Results (from the past 240 hour(s))  C difficile quick scan w PCR reflex     Status: Abnormal   Collection Time: 09/06/17  6:03 PM  Result Value Ref Range Status   C Diff antigen POSITIVE (A) NEGATIVE Final   C Diff toxin NEGATIVE NEGATIVE Final   C Diff interpretation Results are indeterminate. See PCR results.  Final    Comment: Performed at [facility], [place] [address], [place], [place] [address]  C. Diff by PCR, Reflexed     Status: Abnormal   Collection Time: 09/06/17  6:03 PM  Result Value Ref Range Status   Toxigenic C. Difficile by PCR POSITIVE (A) NEGATIVE Final    Comment: Positive for toxigenic C. difficile with little to no toxin production. Only treat if clinical presentation suggests symptomatic illness. Performed at [facility], [place] [address] [place], [place] [address]     FURTHER DISCHARGE INSTRUCTIONS:  Get Medicines reviewed and adjusted: Please take all your medications with you for your next visit with your Primary MD  Laboratory/radiological data: Please request your Primary MD to go over  all hospital tests and procedure/radiological results at the follow up, please ask your Primary MD to get all Hospital records sent to his/her office.  In some cases, they will be blood work, cultures and biopsy results pending at the time of your discharge. Please request that your primary care M.D. goes through all the records of your hospital data and follows up on these results.  Also Note the following: If you experience worsening of your admission symptoms, develop shortness of breath, life threatening emergency, suicidal or homicidal thoughts you must seek medical attention immediately by calling 911 or calling your MD immediately  if symptoms less severe.  You must read  complete instructions/literature along with all the possible adverse reactions/side effects for all the Medicines you take and that have been prescribed to you. Take any new Medicines after you have completely understood and accpet all the possible adverse reactions/side effects.   Do not drive when taking Pain medications or sleeping medications (Benzodaizepines)  Do not take more than prescribed Pain, Sleep and Anxiety Medications. It is not advisable to combine anxiety,sleep and pain medications without talking with your primary care practitioner  Special Instructions: If you have smoked or chewed Tobacco  in the last 2 yrs please stop smoking, stop any regular Alcohol  and or any Recreational drug use.  Wear Seat belts while driving.  Please note: You were cared for by a hospitalist during your hospital stay. Once you are discharged, your primary care physician will handle any further medical issues. Please note that NO REFILLS for any discharge medications will be authorized once you are discharged, as it is imperative that you return to your primary care physician (or establish a relationship with a primary care physician if you do not have one) for your post hospital discharge needs so that they can reassess your need for medications and monitor your lab values.  Total Time spent coordinating discharge including counseling, education and face to face time equals 45 minutes.  SignedOren Binet 09/07/2017 3:08 PM

## 2017-09-07 NOTE — Care Management Note (Signed)
Case Management Note  Patient Details  Name: SUKARI GRIST MRN: [id number] Date of Birth: Sep 04, 1929  Subjective/Objective:                    Action/Plan:  Spoke to patient and son at bedside. Patient from home with son. Son provides 24 hour care and transportation for his mother. Discussed PT recommendations. At first son and patient declining SNF and home health PT, and wheelchair. However, patient lives close to [facility] and decided she would go there for short term rehab. SW aware. Patient and son aware insurance will have to authorize and [facility] would have to have a bed and accept patient.  Expected Discharge Date:                  Expected Discharge Plan:  Skilled Nursing Facility  In-House Referral:  Clinical Social Work  Discharge planning Services  CM Consult  Post Acute Care Choice:  Home Health Choice offered to:  Patient, Adult Children  DME Arranged:    DME Agency:     HH Arranged:    [place] Agency:     Status of Service:  In process, will continue to follow  If discussed at Long Length of Stay Meetings, dates discussed:    Additional Comments:  Marilu Favre, RN 09/07/2017, 12:43 PM

## 2017-09-07 NOTE — Progress Notes (Signed)
Seen and examined.  Per RN-1 loose stool this morning.  Stool was not watery.  No abdominal pain.  No fever or leukocytosis.  Spoke with patient's son-last antimicrobial use was approximately 1 year back.  Upon reviewing the chart-it appears that the patient had evidence of fecal impaction and was disimpacted in the emergency room.  It is highly unlikely that the patient has C. difficile colitis-I think her diarrhea was related to fecal impaction-son endorses fecal incontinence as well.  Apparently she has a long-standing history of constipation-son claims that she stopped using MiraLAX a few weeks back.  Given low suspicion for C. difficile colitis (C Diff PCR likely represents colonization)-stop vancomycin-have encouraged family to use MiraLAX, and to add Senokot if she still does not have a bowel movement.  Patient son refuses SNF-stable to be discharged with home health services.  Patient son aware to seek medical attention if in the unlikely event her diarrhea reoccurs, or she develops a fever or if she develops signs of dehydration.  Stable for discharge-see discharge summary for further details.

## 2017-09-07 NOTE — Social Work (Signed)
CSW met with pt and pt son at bedside, pt son and pt worried about discharge home but it is pt's preference for her to discharge back home. They are aware that pt MD has stated that she is medically stable for discharge. Pt aware [facility] could offer bed, but states she does want to go home. Have passed message on to MD that they would like to go home.  Pt son requesting copy of AVS and will request discharge summary from attending MD, is aware of how to go about this after discharge through medical records.   CSW will f/u with [facility] to let them know pt will discharge home.   Alexander Mt, [place] Work [phone]

## 2017-09-07 NOTE — Care Management (Signed)
09-07-17  BENEFITS CHECK :  REQUEST FOR ADDITIONAL INFO FOR CAPSULE  1. VANCOMYCIN   CAPSULE  50 MG / ML COVER- YES CO-PAY- $ 102.84 TIER- 4 DRUG PRIOR APPROVAL- NO  2. VANCOCIN  CAPSULE  50 MG / ML COVER- NONE FORMULARY PRIOR APPROVAL- YES #  [phone] FOR  EXCEPTION  PREFERRED PHARMACY : YES   [facility] M/O 90 DAY SUPPLY FOR M/O $ 145.00

## 2017-09-07 NOTE — Progress Notes (Signed)
Initial Nutrition Assessment  DOCUMENTATION CODES:   Severe malnutrition in context of chronic illness, Underweight  INTERVENTION:   -Boost Breeze po TID, each supplement provides 250 kcal and 9 grams of protein -MVI with minerals daily  NUTRITION DIAGNOSIS:   Severe Malnutrition related to chronic illness(hypothyroidism) as evidenced by energy intake < or equal to 75% for > or equal to 1 month, moderate fat depletion, severe fat depletion, moderate muscle depletion, severe muscle depletion.  GOAL:   Patient will meet greater than or equal to 90% of their needs  MONITOR:   PO intake, Supplement acceptance, Labs, Weight trends, Skin, I & O's  REASON FOR ASSESSMENT:   Malnutrition Screening Tool    ASSESSMENT:   84 year old woman with medical problems including bioprosthetic aortic valve replacement over 5 years ago, hypertension, hyperlipidemia, chronic left bundle branch block, hypothyroidism presenting with 2 weeks of diarrhea.  Pt admitted with diarrhea and possible c-diff.   Spoke with pt and son at beside. Pt son reports pt has had ongoing poor appetite and weight loss over the past several years. Per son, intake has declined over the past few months ("she refuses most anything we try to feed her"). Typical intake consists of breakfast of yogurt, cereal bar, and a slice of yellow cake, Lunch: a slice of yellow cake. Intake dwindles off throughout the day- pt son shares she will even refuse favorite foods such as baked potatoes. When asked why she won't eat, pt reports "I just can't eat it anymore". She has a strong preference for sweets- she used to consume Ensure supplements, but no longer does.   Pt son endorses wt loss, but unsure how much. Noted wt has stabilized over the past few months, however, pt has had a distant hx of weight loss (limited recent wt hx makes recent wt changes difficult to assess).   Discussed importance of good meal and supplement intake to promote  healing. Pt amenable to try Boost Breeze, due to stomach pain.   Labs reviewed.   NUTRITION - FOCUSED PHYSICAL EXAM:    Most Recent Value  Orbital Region  Moderate depletion  Upper Arm Region  Severe depletion  Thoracic and Lumbar Region  Severe depletion  Buccal Region  Moderate depletion  Temple Region  Moderate depletion  Clavicle Bone Region  Severe depletion  Clavicle and Acromion Bone Region  Severe depletion  Scapular Bone Region  Severe depletion  Dorsal Hand  Severe depletion  Patellar Region  Moderate depletion  Anterior Thigh Region  Moderate depletion  Posterior Calf Region  Moderate depletion  Edema (RD Assessment)  None  Hair  Reviewed  Eyes  Reviewed  Mouth  Reviewed  Skin  Reviewed  Nails  Reviewed       Diet Order:   Diet Order            Diet regular Room service appropriate? Yes; Fluid consistency: Thin  Diet effective now              EDUCATION NEEDS:   Education needs have been addressed  Skin:  Skin Assessment: Skin Integrity Issues: Skin Integrity Issues:: Stage I Stage I: sacrum  Last BM:  09/06/17  Height:   Ht Readings from Last 1 Encounters:  09/06/17 5\' 5"  (1.651 m)    Weight:   Wt Readings from Last 1 Encounters:  09/06/17 43.3 kg    Ideal Body Weight:  56.8 kg  BMI:  Body mass index is 15.89 kg/m.  Estimated Nutritional Needs:  Kcal:  1100-1300  Protein:  55-70 grams  Fluid:  >1.1 L    Olin Gurski A. Jimmye Norman, RD, LDN, CDE Pager: [phone] After hours Pager: [phone]

## 2017-09-07 NOTE — Care Management (Signed)
09-07-17  BENEFITS CHECK :  #  3. S/W [facility]  @ [facility] # [phone]   1. VANCOMYCIN  50 MG/ ML ORAL SOLUTION  125 MG  COVER- NOT PART- D ELIGIBLE PRIOR APPROVAL- YES # [phone] FOR EXCEPTION   2. VANCOCIN   50 MG / ML  ORAL SOLUTION COVER- YES CO-PAY- $ 95.00 TIER 4 DRUG PRIOR APPROVAL- NO  PREFERRED PHARMACY : YES     [facility]

## 2018-05-31 ENCOUNTER — Telehealth: Payer: Self-pay

## 2018-05-31 NOTE — Telephone Encounter (Signed)
Left message for patient to call back.   Patient needs to change appointment time. Patient needs consent documented and to be setup up for virtual visit.

## 2018-06-02 ENCOUNTER — Ambulatory Visit: Payer: Medicare Other | Admitting: Interventional Cardiology

## 2018-06-10 ENCOUNTER — Telehealth: Payer: Self-pay

## 2018-06-10 NOTE — Telephone Encounter (Signed)

## 2018-06-11 ENCOUNTER — Telehealth (INDEPENDENT_AMBULATORY_CARE_PROVIDER_SITE_OTHER): Payer: Medicare Other | Admitting: Interventional Cardiology

## 2018-06-11 ENCOUNTER — Encounter: Payer: Self-pay | Admitting: Interventional Cardiology

## 2018-06-11 ENCOUNTER — Other Ambulatory Visit: Payer: Self-pay

## 2018-06-11 DIAGNOSIS — I359 Nonrheumatic aortic valve disorder, unspecified: Secondary | ICD-10-CM | POA: Diagnosis not present

## 2018-06-11 DIAGNOSIS — I447 Left bundle-branch block, unspecified: Secondary | ICD-10-CM

## 2018-06-11 DIAGNOSIS — E782 Mixed hyperlipidemia: Secondary | ICD-10-CM | POA: Diagnosis not present

## 2018-06-11 DIAGNOSIS — I10 Essential (primary) hypertension: Secondary | ICD-10-CM | POA: Diagnosis not present

## 2018-06-11 NOTE — Telephone Encounter (Signed)
Patient had appointment today.

## 2018-06-11 NOTE — Progress Notes (Signed)
Virtual Visit via Video Note   This visit type was conducted due to national recommendations for restrictions regarding the COVID-19 Pandemic (e.g. social distancing) in an effort to limit this patient's exposure and mitigate transmission in our community.  Due to her co-morbid illnesses, this patient is at least at moderate risk for complications without adequate follow up.  This format is felt to be most appropriate for this patient at this time.  All issues noted in this document were discussed and addressed.  A limited physical exam was performed with this format.  Please refer to the patient's chart for her consent to telehealth for [facility].   Patient unable to access camera  Date:  06/11/2018   ID:  [name], DOB December 21, 1929, MRN [id number]  Patient Location: Home Provider Location: Home  PCP:  [name], MD  Cardiologist:  No primary care provider on file. [place] Electrophysiologist:  None   Evaluation Performed:  Follow-Up Visit  Chief Complaint:  S/p AVR  History of Present Illness:    [name] is a 85 y.o. female with a hx of AS, s/p bioprosthetic AVR 10/2004, perioperative AFib, HTN, HL, LBBB, hypothyroidism.  She is pretty sedentary.  She has had falls in the past, none recently.  Her son helps her.  Denies : Chest pain. Dizziness. Leg edema. Nitroglycerin use. Orthopnea. Palpitations. Paroxysmal nocturnal dyspnea. Shortness of breath. Syncope.   The patient does not have symptoms concerning for COVID-19 infection (fever, chills, cough, or new shortness of breath).    Past Medical History:  Diagnosis Date   A-fib [facility])    perioperative, no recurrence   Aortic valve prosthesis present    Chronic constipation    History of echocardiogram 2008   intact LVEF, 65% nl AVR fxn, mod bileaflet MV thickening   Hx of echocardiogram 2015   Echo (04/2013): Focal basal hypertrophy, EF 50-55%, normal wall motion, mild MR, PASP 33 mm Hg, AVR  okay (mean gradient 6 mmHg)   Hyperlipidemia    Hypertension    Thyroid disease    hypothyroidism    Past Surgical History:  Procedure Laterality Date   COLONOSCOPY N/A 08/26/2013   Procedure: COLONOSCOPY;  Surgeon: Beryle Beams, MD;  Location: [place];  Service: Endoscopy;  Laterality: N/A;     Current Meds  Medication Sig   aspirin EC 81 MG tablet Take 81 mg by mouth daily.   atorvastatin (LIPITOR) 40 MG tablet Take 1 tablet (40 mg total) by mouth daily at 6 PM. (Patient taking differently: Take 40 mg by mouth at bedtime. )   augmented betamethasone dipropionate (DIPROLENE-AF) 0.05 % ointment Apply 1 application topically daily as needed (eczema).    levothyroxine (SYNTHROID, LEVOTHROID) 50 MCG tablet Take 1 tablet (50 mcg total) by mouth daily.   metoprolol (LOPRESSOR) 50 MG tablet Take 25 mg by mouth 2 (two) times daily.    polyethylene glycol (MIRALAX / GLYCOLAX) packet Take 17 g by mouth daily.   senna (SENOKOT) 8.6 MG TABS tablet Take 2 tablets (17.2 mg total) by mouth at bedtime as needed for mild constipation (if no BM inspite of Miralax).     Allergies:   Bactrim [sulfamethoxazole-trimethoprim]; Celebrex [celecoxib]; and Codeine   Social History   Tobacco Use   Smoking status: Never Smoker   Smokeless tobacco: Never Used  Substance Use Topics   Alcohol use: No   Drug use: No     Family Hx: The patient's family history includes Heart attack  in her mother; Heart disease in her father and mother; Hypertension in her mother. There is no history of Stroke.  ROS:   Please see the history of present illness.    Unsteady on her feet All other systems reviewed and are negative.   Prior CV studies:   The following studies were reviewed today:    Labs/Other Tests and Data Reviewed:    EKG:  An ECG dated 5/19 was personally reviewed today and demonstrated:  NSR, LBBB  Recent Labs: 09/06/2017: ALT 16; Magnesium 1.7 09/07/2017: BUN 9; Creatinine,  Ser 0.51; Hemoglobin 10.3; Platelets 173; Potassium 3.5; Sodium 138; TSH 0.238   Recent Lipid Panel No results found for: CHOL, TRIG, HDL, CHOLHDL, LDLCALC, LDLDIRECT  Wt Readings from Last 3 Encounters:  06/11/18 102 lb 8 oz (46.5 kg)  09/06/17 95 lb 7.4 oz (43.3 kg)  05/26/17 96 lb 12.8 oz (43.9 kg)     Objective:    Vital Signs:  BP 107/60    Pulse 78    Ht 5\' 5"  (1.651 m)    Wt 102 lb 8 oz (46.5 kg)    BMI 17.06 kg/m    VITAL SIGNS:  reviewed GEN:  no acute distress RESPIRATORY:  no shortness of breath PSYCH:  flat affect exam limited due to phone format  ASSESSMENT & PLAN:    1. AVR: She has refused to go to the dentist in the past.  Her son is getting her an appt due to the importance for her valve.  He still helps her a lot, but she continues to maintain her activity status from last year, which is not vigorous but adequate for her situation. 2. Hyperlipidemia: LDL 79. COntinue atorvastatin.  3. HTN:: The current medical regimen is effective;  continue present plan and medications. 4. Avoid falls: Uses walker.  5. LBBB: Chronic  COVID-19 Education: The signs and symptoms of COVID-19 were discussed with the patient and how to seek care for testing (follow up with PCP or arrange E-visit).  The importance of social distancing was discussed today.  Time:   Today, I have spent 15 minutes with the patient with telehealth technology discussing the above problems.     Medication Adjustments/Labs and Tests Ordered: Current medicines are reviewed at length with the patient today.  Concerns regarding medicines are outlined above.   Tests Ordered: No orders of the defined types were placed in this encounter.   Medication Changes: No orders of the defined types were placed in this encounter.   Disposition:  Follow up in 1 year(s)  Signed, Larae Grooms, MD  06/11/2018 4:26 PM    [place]

## 2018-06-11 NOTE — Patient Instructions (Signed)

## 2018-06-14 ENCOUNTER — Telehealth: Payer: Medicare Other | Admitting: Interventional Cardiology

## 2019-01-23 ENCOUNTER — Encounter (HOSPITAL_COMMUNITY): Payer: Self-pay | Admitting: Emergency Medicine

## 2019-01-23 ENCOUNTER — Inpatient Hospital Stay (HOSPITAL_COMMUNITY)
Admission: EM | Admit: 2019-01-23 | Discharge: 2019-02-14 | DRG: 207 | Disposition: E | Payer: Medicare Other | Attending: Pulmonary Disease | Admitting: Pulmonary Disease

## 2019-01-23 ENCOUNTER — Other Ambulatory Visit: Payer: Self-pay

## 2019-01-23 DIAGNOSIS — Z885 Allergy status to narcotic agent status: Secondary | ICD-10-CM

## 2019-01-23 DIAGNOSIS — K5909 Other constipation: Secondary | ICD-10-CM | POA: Diagnosis present

## 2019-01-23 DIAGNOSIS — N179 Acute kidney failure, unspecified: Secondary | ICD-10-CM | POA: Diagnosis present

## 2019-01-23 DIAGNOSIS — I10 Essential (primary) hypertension: Secondary | ICD-10-CM | POA: Diagnosis present

## 2019-01-23 DIAGNOSIS — D696 Thrombocytopenia, unspecified: Secondary | ICD-10-CM | POA: Diagnosis present

## 2019-01-23 DIAGNOSIS — J939 Pneumothorax, unspecified: Secondary | ICD-10-CM | POA: Diagnosis present

## 2019-01-23 DIAGNOSIS — E876 Hypokalemia: Secondary | ICD-10-CM | POA: Diagnosis present

## 2019-01-23 DIAGNOSIS — Z7982 Long term (current) use of aspirin: Secondary | ICD-10-CM

## 2019-01-23 DIAGNOSIS — I447 Left bundle-branch block, unspecified: Secondary | ICD-10-CM | POA: Diagnosis present

## 2019-01-23 DIAGNOSIS — U071 COVID-19: Secondary | ICD-10-CM | POA: Diagnosis not present

## 2019-01-23 DIAGNOSIS — Z8249 Family history of ischemic heart disease and other diseases of the circulatory system: Secondary | ICD-10-CM

## 2019-01-23 DIAGNOSIS — J9601 Acute respiratory failure with hypoxia: Secondary | ICD-10-CM | POA: Diagnosis present

## 2019-01-23 DIAGNOSIS — G9340 Encephalopathy, unspecified: Secondary | ICD-10-CM | POA: Diagnosis present

## 2019-01-23 DIAGNOSIS — Z6824 Body mass index (BMI) 24.0-24.9, adult: Secondary | ICD-10-CM

## 2019-01-23 DIAGNOSIS — T68XXXA Hypothermia, initial encounter: Secondary | ICD-10-CM | POA: Diagnosis present

## 2019-01-23 DIAGNOSIS — Z952 Presence of prosthetic heart valve: Secondary | ICD-10-CM

## 2019-01-23 DIAGNOSIS — Z66 Do not resuscitate: Secondary | ICD-10-CM | POA: Diagnosis present

## 2019-01-23 DIAGNOSIS — R17 Unspecified jaundice: Secondary | ICD-10-CM | POA: Diagnosis present

## 2019-01-23 DIAGNOSIS — Z9689 Presence of other specified functional implants: Secondary | ICD-10-CM

## 2019-01-23 DIAGNOSIS — Z888 Allergy status to other drugs, medicaments and biological substances status: Secondary | ICD-10-CM

## 2019-01-23 DIAGNOSIS — R64 Cachexia: Secondary | ICD-10-CM | POA: Diagnosis present

## 2019-01-23 DIAGNOSIS — I480 Paroxysmal atrial fibrillation: Secondary | ICD-10-CM | POA: Diagnosis present

## 2019-01-23 DIAGNOSIS — R451 Restlessness and agitation: Secondary | ICD-10-CM | POA: Diagnosis not present

## 2019-01-23 DIAGNOSIS — Z881 Allergy status to other antibiotic agents status: Secondary | ICD-10-CM

## 2019-01-23 DIAGNOSIS — E860 Dehydration: Secondary | ICD-10-CM | POA: Diagnosis not present

## 2019-01-23 DIAGNOSIS — R627 Adult failure to thrive: Secondary | ICD-10-CM | POA: Diagnosis present

## 2019-01-23 DIAGNOSIS — E785 Hyperlipidemia, unspecified: Secondary | ICD-10-CM | POA: Diagnosis present

## 2019-01-23 DIAGNOSIS — E039 Hypothyroidism, unspecified: Secondary | ICD-10-CM | POA: Diagnosis present

## 2019-01-23 DIAGNOSIS — I959 Hypotension, unspecified: Secondary | ICD-10-CM | POA: Diagnosis not present

## 2019-01-23 DIAGNOSIS — Z9289 Personal history of other medical treatment: Secondary | ICD-10-CM

## 2019-01-23 DIAGNOSIS — Z79890 Hormone replacement therapy (postmenopausal): Secondary | ICD-10-CM

## 2019-01-23 DIAGNOSIS — R54 Age-related physical debility: Secondary | ICD-10-CM | POA: Diagnosis present

## 2019-01-23 DIAGNOSIS — R7989 Other specified abnormal findings of blood chemistry: Secondary | ICD-10-CM | POA: Diagnosis present

## 2019-01-23 DIAGNOSIS — R778 Other specified abnormalities of plasma proteins: Secondary | ICD-10-CM

## 2019-01-23 DIAGNOSIS — R680 Hypothermia, not associated with low environmental temperature: Secondary | ICD-10-CM | POA: Diagnosis present

## 2019-01-23 DIAGNOSIS — E872 Acidosis: Secondary | ICD-10-CM | POA: Diagnosis present

## 2019-01-23 DIAGNOSIS — G9349 Other encephalopathy: Secondary | ICD-10-CM | POA: Diagnosis present

## 2019-01-23 DIAGNOSIS — E870 Hyperosmolality and hypernatremia: Secondary | ICD-10-CM | POA: Diagnosis present

## 2019-01-23 DIAGNOSIS — Z515 Encounter for palliative care: Secondary | ICD-10-CM | POA: Diagnosis not present

## 2019-01-23 DIAGNOSIS — Z4659 Encounter for fitting and adjustment of other gastrointestinal appliance and device: Secondary | ICD-10-CM

## 2019-01-23 DIAGNOSIS — Z79899 Other long term (current) drug therapy: Secondary | ICD-10-CM

## 2019-01-23 NOTE — ED Triage Notes (Signed)
Pt presents to ED from home BIB [facility]. EMS called out for resp complaint Pt found at home not being take care of for multiple days d/t son being in hospital. Pt GCS of 4. No purposeful movement, moans from pain. Pt AAO x0. Pt recently exposed to [place].

## 2019-01-24 ENCOUNTER — Emergency Department (HOSPITAL_COMMUNITY): Payer: Medicare Other

## 2019-01-24 ENCOUNTER — Inpatient Hospital Stay (HOSPITAL_COMMUNITY): Payer: Medicare Other

## 2019-01-24 DIAGNOSIS — R451 Restlessness and agitation: Secondary | ICD-10-CM | POA: Diagnosis not present

## 2019-01-24 DIAGNOSIS — R6521 Severe sepsis with septic shock: Secondary | ICD-10-CM | POA: Diagnosis not present

## 2019-01-24 DIAGNOSIS — E785 Hyperlipidemia, unspecified: Secondary | ICD-10-CM | POA: Diagnosis present

## 2019-01-24 DIAGNOSIS — Z7189 Other specified counseling: Secondary | ICD-10-CM | POA: Diagnosis not present

## 2019-01-24 DIAGNOSIS — U071 COVID-19: Secondary | ICD-10-CM | POA: Diagnosis not present

## 2019-01-24 DIAGNOSIS — J9601 Acute respiratory failure with hypoxia: Secondary | ICD-10-CM

## 2019-01-24 DIAGNOSIS — A419 Sepsis, unspecified organism: Secondary | ICD-10-CM | POA: Diagnosis not present

## 2019-01-24 DIAGNOSIS — E039 Hypothyroidism, unspecified: Secondary | ICD-10-CM | POA: Diagnosis present

## 2019-01-24 DIAGNOSIS — N179 Acute kidney failure, unspecified: Secondary | ICD-10-CM | POA: Diagnosis present

## 2019-01-24 DIAGNOSIS — R64 Cachexia: Secondary | ICD-10-CM | POA: Diagnosis present

## 2019-01-24 DIAGNOSIS — R54 Age-related physical debility: Secondary | ICD-10-CM | POA: Diagnosis present

## 2019-01-24 DIAGNOSIS — I10 Essential (primary) hypertension: Secondary | ICD-10-CM | POA: Diagnosis present

## 2019-01-24 DIAGNOSIS — I959 Hypotension, unspecified: Secondary | ICD-10-CM | POA: Diagnosis not present

## 2019-01-24 DIAGNOSIS — E870 Hyperosmolality and hypernatremia: Secondary | ICD-10-CM | POA: Diagnosis not present

## 2019-01-24 DIAGNOSIS — R627 Adult failure to thrive: Secondary | ICD-10-CM | POA: Diagnosis present

## 2019-01-24 DIAGNOSIS — Z515 Encounter for palliative care: Secondary | ICD-10-CM | POA: Diagnosis not present

## 2019-01-24 DIAGNOSIS — D696 Thrombocytopenia, unspecified: Secondary | ICD-10-CM | POA: Diagnosis present

## 2019-01-24 DIAGNOSIS — E876 Hypokalemia: Secondary | ICD-10-CM | POA: Diagnosis present

## 2019-01-24 DIAGNOSIS — Z66 Do not resuscitate: Secondary | ICD-10-CM | POA: Diagnosis present

## 2019-01-24 DIAGNOSIS — E860 Dehydration: Secondary | ICD-10-CM | POA: Diagnosis present

## 2019-01-24 DIAGNOSIS — R680 Hypothermia, not associated with low environmental temperature: Secondary | ICD-10-CM | POA: Diagnosis present

## 2019-01-24 DIAGNOSIS — R7989 Other specified abnormal findings of blood chemistry: Secondary | ICD-10-CM | POA: Diagnosis present

## 2019-01-24 DIAGNOSIS — R17 Unspecified jaundice: Secondary | ICD-10-CM | POA: Diagnosis present

## 2019-01-24 DIAGNOSIS — J939 Pneumothorax, unspecified: Secondary | ICD-10-CM | POA: Diagnosis present

## 2019-01-24 DIAGNOSIS — G9340 Encephalopathy, unspecified: Secondary | ICD-10-CM | POA: Diagnosis not present

## 2019-01-24 DIAGNOSIS — T68XXXA Hypothermia, initial encounter: Secondary | ICD-10-CM | POA: Diagnosis present

## 2019-01-24 DIAGNOSIS — G9349 Other encephalopathy: Secondary | ICD-10-CM | POA: Diagnosis present

## 2019-01-24 DIAGNOSIS — E872 Acidosis: Secondary | ICD-10-CM | POA: Diagnosis present

## 2019-01-24 DIAGNOSIS — I480 Paroxysmal atrial fibrillation: Secondary | ICD-10-CM | POA: Diagnosis present

## 2019-01-24 LAB — BASIC METABOLIC PANEL
Anion gap: 6 (ref 5–15)
BUN: 93 mg/dL — ABNORMAL HIGH (ref 8–23)
BUN: 87 mg/dL — ABNORMAL HIGH (ref 8–23)
BUN: 97 mg/dL — ABNORMAL HIGH (ref 8–23)
BUN: 90 mg/dL — ABNORMAL HIGH (ref 8–23)
BUN: 87 mg/dL — ABNORMAL HIGH (ref 8–23)
CO2: 19 mmol/L — ABNORMAL LOW (ref 22–32)
CO2: 20 mmol/L — ABNORMAL LOW (ref 22–32)
CO2: 21 mmol/L — ABNORMAL LOW (ref 22–32)
CO2: 18 mmol/L — ABNORMAL LOW (ref 22–32)
CO2: 21 mmol/L — ABNORMAL LOW (ref 22–32)
Calcium: 6.3 mg/dL — CL (ref 8.9–10.3)
Calcium: 6 mg/dL — CL (ref 8.9–10.3)
Calcium: 7 mg/dL — ABNORMAL LOW (ref 8.9–10.3)
Calcium: 6.5 mg/dL — ABNORMAL LOW (ref 8.9–10.3)
Calcium: 6.7 mg/dL — ABNORMAL LOW (ref 8.9–10.3)
Chloride: >130 mmol/L (ref 98–111)
Chloride: >130 mmol/L (ref 98–111)
Chloride: >130 mmol/L (ref 98–111)
Chloride: >130 mmol/L (ref 98–111)
Chloride: 129 mmol/L — ABNORMAL HIGH (ref 98–111)
Creatinine, Ser: 1.39 mg/dL — ABNORMAL HIGH (ref 0.44–1.00)
Creatinine, Ser: 1.25 mg/dL — ABNORMAL HIGH (ref 0.44–1.00)
Creatinine, Ser: 1.53 mg/dL — ABNORMAL HIGH (ref 0.44–1.00)
Creatinine, Ser: 1.35 mg/dL — ABNORMAL HIGH (ref 0.44–1.00)
Creatinine, Ser: 1.3 mg/dL — ABNORMAL HIGH (ref 0.44–1.00)
GFR calc Af Amer: 39 mL/min — ABNORMAL LOW (ref >60)
GFR calc Af Amer: 45 mL/min — ABNORMAL LOW (ref >60)
GFR calc Af Amer: 35 mL/min — ABNORMAL LOW (ref >60)
GFR calc Af Amer: 41 mL/min — ABNORMAL LOW (ref >60)
GFR calc Af Amer: 43 mL/min — ABNORMAL LOW (ref >60)
GFR calc non Af Amer: 34 mL/min — ABNORMAL LOW (ref >60)
GFR calc non Af Amer: 39 mL/min — ABNORMAL LOW (ref >60)
GFR calc non Af Amer: 30 mL/min — ABNORMAL LOW (ref >60)
GFR calc non Af Amer: 35 mL/min — ABNORMAL LOW (ref >60)
GFR calc non Af Amer: 37 mL/min — ABNORMAL LOW (ref >60)
Glucose, Bld: 214 mg/dL — ABNORMAL HIGH (ref 70–99)
Glucose, Bld: 188 mg/dL — ABNORMAL HIGH (ref 70–99)
Glucose, Bld: 166 mg/dL — ABNORMAL HIGH (ref 70–99)
Glucose, Bld: 210 mg/dL — ABNORMAL HIGH (ref 70–99)
Glucose, Bld: 289 mg/dL — ABNORMAL HIGH (ref 70–99)
Potassium: 3.1 mmol/L — ABNORMAL LOW (ref 3.5–5.1)
Potassium: 2.5 mmol/L — CL (ref 3.5–5.1)
Potassium: 2.9 mmol/L — ABNORMAL LOW (ref 3.5–5.1)
Potassium: 4 mmol/L (ref 3.5–5.1)
Potassium: 3.9 mmol/L (ref 3.5–5.1)
Sodium: 165 mmol/L (ref 135–145)
Sodium: 161 mmol/L (ref 135–145)
Sodium: 163 mmol/L (ref 135–145)
Sodium: 161 mmol/L (ref 135–145)
Sodium: 156 mmol/L — ABNORMAL HIGH (ref 135–145)

## 2019-01-24 LAB — HEPATIC FUNCTION PANEL
ALT: 17 U/L (ref 0–44)
AST: 37 U/L (ref 15–41)
Albumin: 2.1 g/dL — ABNORMAL LOW (ref 3.5–5.0)
Alkaline Phosphatase: 73 U/L (ref 38–126)
Bilirubin, Direct: 0.5 mg/dL — ABNORMAL HIGH (ref 0.0–0.2)
Indirect Bilirubin: 1.2 mg/dL — ABNORMAL HIGH (ref 0.3–0.9)
Total Bilirubin: 1.7 mg/dL — ABNORMAL HIGH (ref 0.3–1.2)
Total Protein: 5.4 g/dL — ABNORMAL LOW (ref 6.5–8.1)

## 2019-01-24 LAB — URINALYSIS, ROUTINE W REFLEX MICROSCOPIC
Bacteria, UA: NONE SEEN
Bilirubin Urine: NEGATIVE
Glucose, UA: NEGATIVE mg/dL
Ketones, ur: NEGATIVE mg/dL
Leukocytes,Ua: NEGATIVE
Nitrite: NEGATIVE
Protein, ur: NEGATIVE mg/dL
Specific Gravity, Urine: 1.016 (ref 1.005–1.030)
pH: 5 (ref 5.0–8.0)

## 2019-01-24 LAB — CBC WITH DIFFERENTIAL/PLATELET
Abs Immature Granulocytes: 0.04 10*3/uL (ref 0.00–0.07)
Basophils Absolute: 0 10*3/uL (ref 0.0–0.1)
Basophils Relative: 0 %
Eosinophils Absolute: 0 10*3/uL (ref 0.0–0.5)
Eosinophils Relative: 0 %
HCT: 51.9 % — ABNORMAL HIGH (ref 36.0–46.0)
Hemoglobin: 16.3 g/dL — ABNORMAL HIGH (ref 12.0–15.0)
Immature Granulocytes: 0 %
Lymphocytes Relative: 13 %
Lymphs Abs: 1.3 10*3/uL (ref 0.7–4.0)
MCH: 38.9 pg — ABNORMAL HIGH (ref 26.0–34.0)
MCHC: 31.4 g/dL (ref 30.0–36.0)
MCV: 123.9 fL — ABNORMAL HIGH (ref 80.0–100.0)
Monocytes Absolute: 0.5 10*3/uL (ref 0.1–1.0)
Monocytes Relative: 5 %
Neutro Abs: 8.6 10*3/uL — ABNORMAL HIGH (ref 1.7–7.7)
Neutrophils Relative %: 82 %
Platelets: 187 10*3/uL (ref 150–400)
RBC: 4.19 MIL/uL (ref 3.87–5.11)
RDW: 13.6 % (ref 11.5–15.5)
WBC: 10.7 10*3/uL — ABNORMAL HIGH (ref 4.0–10.5)
nRBC: 0.8 % — ABNORMAL HIGH (ref 0.0–0.2)

## 2019-01-24 LAB — CBC
HCT: 28.3 % — ABNORMAL LOW (ref 36.0–46.0)
Hemoglobin: 8.6 g/dL — ABNORMAL LOW (ref 12.0–15.0)
MCH: 38.6 pg — ABNORMAL HIGH (ref 26.0–34.0)
MCHC: 30.4 g/dL (ref 30.0–36.0)
MCV: 126.9 fL — ABNORMAL HIGH (ref 80.0–100.0)
Platelets: 94 10*3/uL — ABNORMAL LOW (ref 150–400)
RBC: 2.23 MIL/uL — ABNORMAL LOW (ref 3.87–5.11)
RDW: 13.6 % (ref 11.5–15.5)
WBC: 7.8 10*3/uL (ref 4.0–10.5)
nRBC: 0.5 % — ABNORMAL HIGH (ref 0.0–0.2)

## 2019-01-24 LAB — COMPREHENSIVE METABOLIC PANEL
ALT: 21 U/L (ref 0–44)
AST: 44 U/L — ABNORMAL HIGH (ref 15–41)
Albumin: 2.9 g/dL — ABNORMAL LOW (ref 3.5–5.0)
Alkaline Phosphatase: 94 U/L (ref 38–126)
BUN: 121 mg/dL — ABNORMAL HIGH (ref 8–23)
CO2: 24 mmol/L (ref 22–32)
Calcium: 8.8 mg/dL — ABNORMAL LOW (ref 8.9–10.3)
Chloride: >130 mmol/L (ref 98–111)
Creatinine, Ser: 1.79 mg/dL — ABNORMAL HIGH (ref 0.44–1.00)
GFR calc Af Amer: 29 mL/min — ABNORMAL LOW (ref >60)
GFR calc non Af Amer: 25 mL/min — ABNORMAL LOW (ref >60)
Glucose, Bld: 178 mg/dL — ABNORMAL HIGH (ref 70–99)
Potassium: 3.3 mmol/L — ABNORMAL LOW (ref 3.5–5.1)
Sodium: 172 mmol/L (ref 135–145)
Total Bilirubin: 3 mg/dL — ABNORMAL HIGH (ref 0.3–1.2)
Total Protein: 7.3 g/dL (ref 6.5–8.1)

## 2019-01-24 LAB — FIBRINOGEN: Fibrinogen: 542 mg/dL — ABNORMAL HIGH (ref 210–475)

## 2019-01-24 LAB — LACTIC ACID, PLASMA
Lactic Acid, Venous: 3.9 mmol/L (ref 0.5–1.9)
Lactic Acid, Venous: 2.9 mmol/L (ref 0.5–1.9)

## 2019-01-24 LAB — PROTIME-INR
INR: 1.3 — ABNORMAL HIGH (ref 0.8–1.2)
Prothrombin Time: 16 seconds — ABNORMAL HIGH (ref 11.4–15.2)

## 2019-01-24 LAB — TROPONIN I (HIGH SENSITIVITY)
Troponin I (High Sensitivity): 240 ng/L (ref <18)
Troponin I (High Sensitivity): 218 ng/L (ref <18)

## 2019-01-24 LAB — GLUCOSE, CAPILLARY
Glucose-Capillary: 146 mg/dL — ABNORMAL HIGH (ref 70–99)
Glucose-Capillary: 168 mg/dL — ABNORMAL HIGH (ref 70–99)
Glucose-Capillary: 120 mg/dL — ABNORMAL HIGH (ref 70–99)
Glucose-Capillary: 173 mg/dL — ABNORMAL HIGH (ref 70–99)
Glucose-Capillary: 177 mg/dL — ABNORMAL HIGH (ref 70–99)
Glucose-Capillary: 180 mg/dL — ABNORMAL HIGH (ref 70–99)

## 2019-01-24 LAB — MAGNESIUM: Magnesium: 2.3 mg/dL (ref 1.7–2.4)

## 2019-01-24 LAB — PHOSPHORUS: Phosphorus: 3.6 mg/dL (ref 2.5–4.6)

## 2019-01-24 LAB — POC SARS CORONAVIRUS 2 AG -  ED: SARS Coronavirus 2 Ag: POSITIVE — AB

## 2019-01-24 LAB — HEMOGLOBIN A1C
Hgb A1c MFr Bld: 5.5 % (ref 4.8–5.6)
Mean Plasma Glucose: 111.15 mg/dL

## 2019-01-24 LAB — URINE CULTURE: Culture: NO GROWTH

## 2019-01-24 LAB — HIV ANTIBODY (ROUTINE TESTING W REFLEX): HIV Screen 4th Generation wRfx: NONREACTIVE

## 2019-01-24 LAB — LIPASE, BLOOD: Lipase: 33 U/L (ref 11–51)

## 2019-01-24 LAB — TYPE AND SCREEN
ABO/RH(D): O POS
Antibody Screen: NEGATIVE

## 2019-01-24 LAB — T4, FREE: Free T4: 0.59 ng/dL — ABNORMAL LOW (ref 0.61–1.12)

## 2019-01-24 LAB — CBG MONITORING, ED: Glucose-Capillary: 151 mg/dL — ABNORMAL HIGH (ref 70–99)

## 2019-01-24 LAB — CK: Total CK: 107 U/L (ref 38–234)

## 2019-01-24 LAB — TSH: TSH: 3.983 u[IU]/mL (ref 0.350–4.500)

## 2019-01-24 LAB — FERRITIN: Ferritin: 648 ng/mL — ABNORMAL HIGH (ref 11–307)

## 2019-01-24 LAB — VITAMIN B12: Vitamin B-12: 1626 pg/mL — ABNORMAL HIGH (ref 180–914)

## 2019-01-24 LAB — PROCALCITONIN: Procalcitonin: 0.15 ng/mL

## 2019-01-24 LAB — C-REACTIVE PROTEIN: CRP: 9.4 mg/dL — ABNORMAL HIGH (ref <1.0)

## 2019-01-24 LAB — LACTATE DEHYDROGENASE: LDH: 396 U/L — ABNORMAL HIGH (ref 98–192)

## 2019-01-24 LAB — D-DIMER, QUANTITATIVE: D-Dimer, Quant: 6.83 ug/mL-FEU — ABNORMAL HIGH (ref 0.00–0.50)

## 2019-01-24 MED ORDER — FENTANYL BOLUS VIA INFUSION
25.0000 ug | INTRAVENOUS | Status: DC | PRN
  Filled 2019-01-24: qty 25

## 2019-01-24 MED ORDER — SODIUM CHLORIDE 0.9 % IV BOLUS
1000.0000 mL | Freq: Once | INTRAVENOUS | Status: AC
  Administered 2019-01-24: 1000 mL via INTRAVENOUS

## 2019-01-24 MED ORDER — INSULIN ASPART 100 UNIT/ML ~~LOC~~ SOLN
0.0000 [IU] | SUBCUTANEOUS | Status: DC
  Administered 2019-01-24 – 2019-01-25 (×5): 2 [IU] via SUBCUTANEOUS
  Administered 2019-01-25 (×2): 3 [IU] via SUBCUTANEOUS
  Administered 2019-01-25: 01:00:00 2 [IU] via SUBCUTANEOUS
  Administered 2019-01-25: 05:00:00 3 [IU] via SUBCUTANEOUS
  Administered 2019-01-25: 2 [IU] via SUBCUTANEOUS
  Administered 2019-01-26: 05:00:00 3 [IU] via SUBCUTANEOUS
  Administered 2019-01-26: 12:00:00 2 [IU] via SUBCUTANEOUS
  Administered 2019-01-26 (×3): 3 [IU] via SUBCUTANEOUS
  Administered 2019-01-26 (×2): 2 [IU] via SUBCUTANEOUS
  Administered 2019-01-27: 04:00:00 1 [IU] via SUBCUTANEOUS

## 2019-01-24 MED ORDER — FENTANYL CITRATE (PF) 100 MCG/2ML IJ SOLN: 25.0000 ug | Freq: Once | INTRAMUSCULAR | Status: DC

## 2019-01-24 MED ORDER — FENTANYL CITRATE (PF) 100 MCG/2ML IJ SOLN
25.0000 ug | INTRAMUSCULAR | Status: DC | PRN
  Filled 2019-01-24: qty 2

## 2019-01-24 MED ORDER — BISACODYL 10 MG RE SUPP: 10.0000 mg | Freq: Every day | RECTAL | Status: DC | PRN

## 2019-01-24 MED ORDER — DEXTROSE 5 % IV SOLN: INTRAVENOUS | Status: DC

## 2019-01-24 MED ORDER — SODIUM CHLORIDE 0.9 % IV SOLN
2.0000 g | INTRAVENOUS | Status: AC
  Administered 2019-01-24 – 2019-01-28 (×5): 2 g via INTRAVENOUS
  Filled 2019-01-24 (×4): qty 20

## 2019-01-24 MED ORDER — FAMOTIDINE IN NACL 20-0.9 MG/50ML-% IV SOLN: 20.0000 mg | Freq: Two times a day (BID) | INTRAVENOUS | Status: DC

## 2019-01-24 MED ORDER — FENTANYL CITRATE (PF) 100 MCG/2ML IJ SOLN
INTRAMUSCULAR | Status: AC
  Filled 2019-01-24: qty 2

## 2019-01-24 MED ORDER — POTASSIUM CHLORIDE 10 MEQ/100ML IV SOLN
10.0000 meq | Freq: Once | INTRAVENOUS | Status: AC
  Administered 2019-01-24: 06:00:00 10 meq via INTRAVENOUS
  Filled 2019-01-24: qty 100

## 2019-01-24 MED ORDER — NOREPINEPHRINE 4 MG/250ML-% IV SOLN: 0.0000 ug/min | INTRAVENOUS | Status: DC

## 2019-01-24 MED ORDER — LEVOTHYROXINE SODIUM 50 MCG PO TABS
50.0000 ug | ORAL_TABLET | Freq: Every day | ORAL | Status: DC
  Administered 2019-01-26: 05:00:00 50 ug via ORAL
  Filled 2019-01-24 (×2): qty 1

## 2019-01-24 MED ORDER — SUCCINYLCHOLINE CHLORIDE 20 MG/ML IJ SOLN
75.0000 mg | Freq: Once | INTRAMUSCULAR | Status: AC
  Administered 2019-01-24: 03:00:00 75 mg via INTRAVENOUS

## 2019-01-24 MED ORDER — NOREPINEPHRINE 4 MG/250ML-% IV SOLN
0.0000 ug/min | INTRAVENOUS | Status: DC
  Administered 2019-01-24: 07:00:00 2 ug/min via INTRAVENOUS
  Administered 2019-01-24: 21:00:00 6 ug/min via INTRAVENOUS
  Administered 2019-01-27: 22:00:00 2 ug/min via INTRAVENOUS
  Filled 2019-01-24 (×3): qty 250

## 2019-01-24 MED ORDER — SODIUM CHLORIDE 0.9 % IV SOLN
100.0000 mg | Freq: Every day | INTRAVENOUS | Status: DC
  Administered 2019-01-25 – 2019-01-28 (×4): 100 mg via INTRAVENOUS
  Filled 2019-01-24 (×5): qty 20

## 2019-01-24 MED ORDER — SODIUM CHLORIDE 0.9 % IV BOLUS
1000.0000 mL | Freq: Once | INTRAVENOUS | Status: AC
  Administered 2019-01-24: 02:00:00 1000 mL via INTRAVENOUS

## 2019-01-24 MED ORDER — POTASSIUM CHLORIDE 10 MEQ/100ML IV SOLN
10.0000 meq | INTRAVENOUS | Status: AC
  Administered 2019-01-24: 05:00:00 10 meq via INTRAVENOUS
  Filled 2019-01-24: qty 100

## 2019-01-24 MED ORDER — CHLORHEXIDINE GLUCONATE CLOTH 2 % EX PADS
6.0000 | MEDICATED_PAD | Freq: Every day | CUTANEOUS | Status: DC
  Administered 2019-01-24 – 2019-01-27 (×4): 6 via TOPICAL

## 2019-01-24 MED ORDER — FREE WATER
250.0000 mL | Status: DC
  Administered 2019-01-25 – 2019-01-26 (×8): 250 mL

## 2019-01-24 MED ORDER — HEPARIN SODIUM (PORCINE) 5000 UNIT/ML IJ SOLN
5000.0000 [IU] | Freq: Three times a day (TID) | INTRAMUSCULAR | Status: DC
  Administered 2019-01-24 – 2019-01-25 (×3): 5000 [IU] via SUBCUTANEOUS
  Filled 2019-01-24 (×3): qty 1

## 2019-01-24 MED ORDER — CHLORHEXIDINE GLUCONATE 0.12% ORAL RINSE (MEDLINE KIT)
15.0000 mL | Freq: Two times a day (BID) | OROMUCOSAL | Status: DC
  Administered 2019-01-24 – 2019-01-28 (×9): 15 mL via OROMUCOSAL

## 2019-01-24 MED ORDER — SODIUM CHLORIDE 0.9 % IV BOLUS
1000.0000 mL | Freq: Once | INTRAVENOUS | Status: AC
  Administered 2019-01-24: 03:00:00 1000 mL via INTRAVENOUS

## 2019-01-24 MED ORDER — THIAMINE HCL 100 MG/ML IJ SOLN
100.0000 mg | INTRAMUSCULAR | Status: DC
  Administered 2019-01-24 – 2019-01-28 (×5): 100 mg via INTRAVENOUS
  Filled 2019-01-24 (×5): qty 2

## 2019-01-24 MED ORDER — POTASSIUM CHLORIDE 10 MEQ/50ML IV SOLN
10.0000 meq | INTRAVENOUS | Status: AC
  Administered 2019-01-24 (×4): 10 meq via INTRAVENOUS
  Filled 2019-01-24 (×4): qty 50

## 2019-01-24 MED ORDER — FENTANYL 2500MCG IN NS 250ML (10MCG/ML) PREMIX INFUSION
25.0000 ug/h | INTRAVENOUS | Status: DC
  Administered 2019-01-24: 05:00:00 25 ug/h via INTRAVENOUS
  Administered 2019-01-26: 08:00:00 50 ug/h via INTRAVENOUS
  Filled 2019-01-24 (×2): qty 250

## 2019-01-24 MED ORDER — SODIUM CHLORIDE 0.9 % IV SOLN
200.0000 mg | Freq: Once | INTRAVENOUS | Status: AC
  Administered 2019-01-24: 12:00:00 200 mg via INTRAVENOUS
  Filled 2019-01-24: qty 200
  Filled 2019-01-24: qty 40

## 2019-01-24 MED ORDER — FAMOTIDINE IN NACL 20-0.9 MG/50ML-% IV SOLN
20.0000 mg | INTRAVENOUS | Status: DC
  Administered 2019-01-24 – 2019-01-27 (×4): 20 mg via INTRAVENOUS
  Filled 2019-01-24 (×7): qty 50

## 2019-01-24 MED ORDER — POTASSIUM CHLORIDE 10 MEQ/100ML IV SOLN
10.0000 meq | Freq: Once | INTRAVENOUS | Status: AC
  Administered 2019-01-24: 03:00:00 10 meq via INTRAVENOUS
  Filled 2019-01-24: qty 100

## 2019-01-24 MED ORDER — NOREPINEPHRINE 4 MG/250ML-% IV SOLN
INTRAVENOUS | Status: AC
  Filled 2019-01-24: qty 250

## 2019-01-24 MED ORDER — SENNOSIDES 8.8 MG/5ML PO SYRP: 5.0000 mL | ORAL_SOLUTION | Freq: Two times a day (BID) | ORAL | Status: DC | PRN

## 2019-01-24 MED ORDER — ETOMIDATE 2 MG/ML IV SOLN
20.0000 mg | Freq: Once | INTRAVENOUS | Status: AC
  Administered 2019-01-24: 03:00:00 20 mg via INTRAVENOUS

## 2019-01-24 MED ORDER — FENTANYL CITRATE (PF) 100 MCG/2ML IJ SOLN: 100.0000 ug | Freq: Once | INTRAMUSCULAR | Status: DC

## 2019-01-24 MED ORDER — DEXAMETHASONE SODIUM PHOSPHATE 10 MG/ML IJ SOLN
6.0000 mg | INTRAMUSCULAR | Status: DC
  Administered 2019-01-24 – 2019-01-28 (×5): 6 mg via INTRAVENOUS
  Filled 2019-01-24 (×5): qty 1

## 2019-01-24 MED ORDER — DEXAMETHASONE SODIUM PHOSPHATE 10 MG/ML IJ SOLN: 6.0000 mg | INTRAMUSCULAR | Status: DC

## 2019-01-24 MED ORDER — FENTANYL CITRATE (PF) 100 MCG/2ML IJ SOLN
100.0000 ug | Freq: Once | INTRAMUSCULAR | Status: AC
  Administered 2019-01-24: 04:00:00 100 ug via INTRAVENOUS

## 2019-01-24 MED ORDER — ORAL CARE MOUTH RINSE
15.0000 mL | OROMUCOSAL | Status: DC
  Administered 2019-01-24 – 2019-01-28 (×45): 15 mL via OROMUCOSAL

## 2019-01-24 MED ORDER — FENTANYL CITRATE (PF) 100 MCG/2ML IJ SOLN
25.0000 ug | INTRAMUSCULAR | Status: DC | PRN
  Administered 2019-01-26 – 2019-01-27 (×5): 50 ug via INTRAVENOUS
  Filled 2019-01-24 (×4): qty 2

## 2019-01-24 MED FILL — Fentanyl Citrate Preservative Free (PF) Inj 2500 MCG/50ML: INTRAMUSCULAR | Qty: 50 | Status: AC

## 2019-01-24 MED FILL — Sodium Chloride IV Soln 0.9%: INTRAVENOUS | Qty: 250 | Status: AC

## 2019-01-24 NOTE — Plan of Care (Signed)
Called to admit patient by Dr. Roxanne Mins.  86 yo F with severe disease from COVID including AMS, hypernatremia, AKF, hypothermia.  Patient a full code.  Before I could even evaluate patient or put in more than a couple orders, Dr. Roxanne Mins called me back and informed me he was intubating patient due to development of hypoxia and that PCCM would need to admit patient.  Ill put a call in to PCCM to let them know.

## 2019-01-24 NOTE — Progress Notes (Signed)
Transported pt to CT with RN at bedside

## 2019-01-24 NOTE — ED Provider Notes (Signed)
[facility] EMERGENCY DEPARTMENT Provider Note   CSN: [id number] Arrival date & time: 01/25/2019  2328   History Chief Complaint  Patient presents with  . Respiratory Distress    [name] is a 86 y.o. female.  The history is provided by the EMS personnel. The history is limited by the condition of the patient (Altered mental status).  She has history of atrial fibrillation, hypertension, hyperlipidemia and is brought in to the emergency department by ambulance because of general decline.  Apparently, she lives with her son who had been hospitalized with COVID-19.  He returned home 1 week ago and during this time, patient was home alone.  Son reported to EMS that she stopped talking 2 days ago.  No other history is available.  She is full code.  Past Medical History:  Diagnosis Date  . A-fib (HCC)    perioperative, no recurrence  . Aortic valve prosthesis present   . Chronic constipation   . History of echocardiogram 2008   intact LVEF, 65% nl AVR fxn, mod bileaflet MV thickening  . Hx of echocardiogram 2015   Echo (04/2013): Focal basal hypertrophy, EF 50-55%, normal wall motion, mild MR, PASP 33 mm Hg, AVR okay (mean gradient 6 mmHg)  . Hyperlipidemia   . Hypertension   . Thyroid disease    hypothyroidism     Patient Active Problem List   Diagnosis Date Noted  . Fecal impaction ([place])   . Diarrhea 09/06/2017  . LBBB (left bundle branch block) 05/16/2016  . Weight loss 04/20/2014  . Diverticulosis of colon with hemorrhage 08/27/2013  . Stage I pressure ulcer of sacral region 08/25/2013  . Hypothyroid 08/25/2013  . Rectal bleeding 08/24/2013  . Aortic valve disorder 04/26/2013  . Essential hypertension, benign 03/04/2013  . Hyperlipidemia 03/04/2013  . Heart valve replaced by other means 03/04/2013    Past Surgical History:  Procedure Laterality Date  . COLONOSCOPY N/A 08/26/2013   Procedure: COLONOSCOPY;  Surgeon: Beryle Beams, MD;  Location:  [place];  Service: Endoscopy;  Laterality: N/A;     OB History   No obstetric history on file.     Family History  Problem Relation Age of Onset  . Heart disease Mother   . Heart attack Mother   . Hypertension Mother   . Heart disease Father   . Stroke Neg Hx     Social History   Tobacco Use  . Smoking status: Never Smoker  . Smokeless tobacco: Never Used  Substance Use Topics  . Alcohol use: No  . Drug use: No    Home Medications Prior to Admission medications   Medication Sig Start Date End Date Taking? Authorizing Provider  aspirin EC 81 MG tablet Take 81 mg by mouth daily.    [provider]  atorvastatin (LIPITOR) 40 MG tablet Take 1 tablet (40 mg total) by mouth daily at 6 PM. Patient taking differently: Take 40 mg by mouth at bedtime.  04/24/14   Richardson Dopp T, PA-C  augmented betamethasone dipropionate (DIPROLENE-AF) 0.05 % ointment Apply 1 application topically daily as needed (eczema).     [provider]  levothyroxine (SYNTHROID, LEVOTHROID) 50 MCG tablet Take 1 tablet (50 mcg total) by mouth daily. 09/07/17   Ghimire, Henreitta Leber, MD  metoprolol (LOPRESSOR) 50 MG tablet Take 25 mg by mouth 2 (two) times daily.     [provider]  polyethylene glycol (MIRALAX / GLYCOLAX) packet Take 17 g by mouth daily.  09/07/17   Ghimire, Henreitta Leber, MD  senna (SENOKOT) 8.6 MG TABS tablet Take 2 tablets (17.2 mg total) by mouth at bedtime as needed for mild constipation (if no BM inspite of Miralax). 09/07/17   Ghimire, Henreitta Leber, MD    Allergies    Bactrim [sulfamethoxazole-trimethoprim], Celebrex [celecoxib], and Codeine  Review of Systems   Review of Systems  Unable to perform ROS: Mental status change    Physical Exam Updated Vital Signs BP (!) 96/57 (BP Location: Right Arm)   Pulse 100   Resp (!) 22   SpO2 92%   Physical Exam Vitals and nursing note reviewed.   Cachectic 86 year old female, resting comfortably and in no acute  distress. Vital signs are significant for elevated respiratory rate. Oxygen saturation is 92%, which is normal. Head is normocephalic and atraumatic. PERRLA, EOMI. Oropharynx is clear.  Eyes are sunken and mucous membranes are dry. Neck is nontender and supple without adenopathy or JVD. Back is nontender and there is no CVA tenderness. Lungs are clear without rales, wheezes, or rhonchi. Chest is nontender. Heart has regular rate and rhythm without murmur. Abdomen is soft, flat, nontender.  Large mass palpable in the mid abdomen, poorly defined.  There is no hepatosplenomegaly and peristalsis is hypoactive. Extremities have no cyanosis or edema, full range of motion is present. Skin is warm and dry without rash.  Markedly decreased skin turgor. Neurologic: Minimally responsive to painful stimuli, cranial nerves are grossly intact.  Unable to evaluate for motor or sensory function.  ED Results / Procedures / Treatments   Labs (all labs ordered are listed, but only abnormal results are displayed) Labs Reviewed  COMPREHENSIVE METABOLIC PANEL - Abnormal; Notable for the following components:      Result Value   Sodium 172 (*)    Potassium 3.3 (*)    Chloride >130 (*)    Glucose, Bld 178 (*)    BUN 121 (*)    Creatinine, Ser 1.79 (*)    Calcium 8.8 (*)    Albumin 2.9 (*)    AST 44 (*)    Total Bilirubin 3.0 (*)    GFR calc non Af Amer 25 (*)    GFR calc Af Amer 29 (*)    All other components within normal limits  URINALYSIS, ROUTINE W REFLEX MICROSCOPIC - Abnormal; Notable for the following components:   Color, Urine AMBER (*)    APPearance HAZY (*)    Hgb urine dipstick LARGE (*)    All other components within normal limits  CBC WITH DIFFERENTIAL/PLATELET - Abnormal; Notable for the following components:   WBC 10.7 (*)    Hemoglobin 16.3 (*)    HCT 51.9 (*)    MCV 123.9 (*)    MCH 38.9 (*)    nRBC 0.8 (*)    Neutro Abs 8.6 (*)    All other components within normal limits    LACTIC ACID, PLASMA - Abnormal; Notable for the following components:   Lactic Acid, Venous 3.9 (*)    All other components within normal limits  D-DIMER, QUANTITATIVE (NOT AT [facility]) - Abnormal; Notable for the following components:   D-Dimer, Quant 6.83 (*)    All other components within normal limits  FIBRINOGEN - Abnormal; Notable for the following components:   Fibrinogen 542 (*)    All other components within normal limits  POC SARS CORONAVIRUS 2 AG -  ED - Abnormal; Notable for the following components:   SARS Coronavirus 2 Ag  POSITIVE (*)    All other components within normal limits  TROPONIN I (HIGH SENSITIVITY) - Abnormal; Notable for the following components:   Troponin I (High Sensitivity) 240 (*)    All other components within normal limits  URINE CULTURE  LIPASE, BLOOD  LACTIC ACID, PLASMA  FERRITIN  LACTATE DEHYDROGENASE  C-REACTIVE PROTEIN  PROCALCITONIN  BASIC METABOLIC PANEL  BASIC METABOLIC PANEL  BASIC METABOLIC PANEL  BASIC METABOLIC PANEL  BASIC METABOLIC PANEL  HEMOGLOBIN A1C  I-STAT ARTERIAL BLOOD GAS, ED  TYPE AND SCREEN  TROPONIN I (HIGH SENSITIVITY)    EKG EKG Interpretation  Date/Time:  Sunday January 23 2019 23:37:00 EST Ventricular Rate:  99 PR Interval:    QRS Duration: 137 QT Interval:  421 QTC Calculation: 541 R Axis:   -133 Text Interpretation: Sinus rhythm Left bundle branch block When compared with ECG of 08/24/2013, Axis has shifted rightward Confirmed by Delora Fuel ([id number]) on 01/24/2019 12:15:37 AM   Radiology DG Chest Port 1 View  Result Date: 01/24/2019 CLINICAL DATA:  Altered mental status EXAM: PORTABLE CHEST 1 VIEW COMPARISON:  April 24, 2013 FINDINGS: The heart size and mediastinal contours are unchanged. Aortic knob calcifications. Overlying median sternotomy wires. There is subpleural thickening seen within the periphery of the right mid lung. No large airspace consolidation or pleural effusion. No acute osseous  abnormality. IMPRESSION: No active disease. Electronically Signed   By: Prudencio Pair M.D.   On: 01/24/2019 00:38    Procedures .Central Line  Date/Time: 01/30/2019 11:55 PM Performed by: Delora Fuel, MD Authorized by: Delora Fuel, MD   Consent:    Consent obtained:  Emergent situation Pre-procedure details:    Hand hygiene: Hand hygiene performed prior to insertion     Sterile barrier technique: All elements of maximal sterile technique followed     Skin preparation:  2% chlorhexidine   Skin preparation agent: Skin preparation agent completely dried prior to procedure   Procedure details:    Location:  R femoral   Site selection rationale:  Ease of access   Patient position:  Flat   Procedural supplies:  Triple lumen   Catheter size:  5.5 Fr   Landmarks identified: yes     Ultrasound guidance: no     Number of attempts:  3   Successful placement: yes   Post-procedure details:    Post-procedure:  Dressing applied and line sutured   Assessment:  Blood return through all ports   Patient tolerance of procedure:  Tolerated well, no immediate complications  Procedure Name: Intubation Date/Time: 01/24/2019 2:25 AM Performed by: Delora Fuel, MD Pre-anesthesia Checklist: Patient identified, Patient being monitored, Emergency Drugs available, Timeout performed and Suction available Oxygen Delivery Method: Non-rebreather mask Preoxygenation: Pre-oxygenation with 100% oxygen Induction Type: Rapid sequence Ventilation: Mask ventilation without difficulty Laryngoscope Size: Glidescope and 3 Grade View: Grade I Tube size: 7.5 mm Number of attempts: 1 Placement Confirmation: ETT inserted through vocal cords under direct vision,  CO2 detector and Breath sounds checked- equal and bilateral Secured at: 24 cm Tube secured with: ETT holder Dental Injury: Teeth and Oropharynx as per pre-operative assessment      CRITICAL CARE Performed by: Delora Fuel Total critical care time: 150  minutes Critical care time was exclusive of separately billable procedures and treating other patients. Critical care was necessary to treat or prevent imminent or life-threatening deterioration. Critical care was time spent personally by me on the following activities: development of treatment plan with patient and/or surrogate as well  as nursing, discussions with consultants, evaluation of patient's response to treatment, examination of patient, obtaining history from patient or surrogate, ordering and performing treatments and interventions, ordering and review of laboratory studies, ordering and review of radiographic studies, pulse oximetry and re-evaluation of patient's condition.i  Medications Ordered in ED Medications  potassium chloride 10 mEq in 100 mL IVPB (has no administration in time range)  dextrose 5 % solution (has no administration in time range)  potassium chloride 10 mEq in 100 mL IVPB (has no administration in time range)  insulin aspart (novoLOG) injection 0-9 Units (has no administration in time range)  sodium chloride 0.9 % bolus 1,000 mL (0 mLs Intravenous Stopped 01/24/19 0135)  sodium chloride 0.9 % bolus 1,000 mL (1,000 mLs Intravenous New Bag/Given 01/24/19 0210)    ED Course  I have reviewed the triage vital signs and the nursing notes.  Pertinent labs & imaging results that were available during my care of the patient were reviewed by me and considered in my medical decision making (see chart for details).  MDM Rules/Calculators/A&P  Patient presenting with decreased level of consciousness and appears tremendously dehydrated.  She had exposure to COVID-19, so there is definitely concern for COVID-19 illness.  She will need to be evaluated for possible sepsis.  Old records are reviewed, and she is followed by cardiology for aortic valve replacement with a bioprosthetic valve, left bundle branch block.  When patient arrived, venous access is very difficult to obtain,  so 3 lm right femoral vein line was inserted for ease of administering fluids and medications, and also for ease of obtaining blood for testing.  Nursing staff had contacted patient's son who confirms that she is full code.  She is noted to be hypothermic and is started on external warmer. Patient is demonstrating good decreasing respiratory effort and is becoming hypoxic, decision is made to intubate. Labs show severe hypernatremia and acute kidney injury with marked prerenal azotemia. Hemoglobin is 6 g higher than prior, also consistent with dehydration. Troponin is also noted to be elevated of uncertain cause. Lactic acid is mildly to moderately elevated and felt that the secondary to dehydration rather than sepsis. Chest x-ray had shown no acute process, but if pneumonia is present, may appear following hydration. Following intubation, blood pressure is dropping. She is given additional fluids and is started on norepinephrine drip. Case discussed with Dr. Hessie Knows of critical care service who agrees to admit the patient.  Chest x-ray post intubation questions whether she might have a pneumothorax.  Lateral decubitus x-rays obtained confirming a small pneumothorax.  Critical care physician is here evaluating the patient is inserting a right-sided pigtail catheter.  [name] was evaluated in Emergency Department on 01/24/2019 for the symptoms described in the history of present illness. She was evaluated in the context of the global COVID-19 pandemic, which necessitated consideration that the patient might be at risk for infection with the SARS-CoV-2 virus that causes COVID-19. Institutional protocols and algorithms that pertain to the evaluation of patients at risk for COVID-19 are in a state of rapid change based on information released by regulatory bodies including the CDC and federal and state organizations. These policies and algorithms were followed during the patient's care in the ED.  Final  Clinical Impression(s) / ED Diagnoses Final diagnoses:  Acute respiratory failure with hypoxia ([place])  COVID-19 virus infection  Dehydration  Hypernatremia  Acute kidney injury (nontraumatic) (HCC)  Hypokalemia  Elevated troponin  Elevated lactic acid level  Elevated d-dimer  Serum total bilirubin elevated  Pneumothorax, right    Rx / DC Orders ED Discharge Orders    None       Delora Fuel, [place] [id number] [phone]

## 2019-01-24 NOTE — ED Notes (Signed)
Informed Dr Roxanne Mins and RN Threasa Beards pt covid pos

## 2019-01-24 NOTE — Progress Notes (Signed)
Patient arrived to 17M from ED intubated and not following commands.Vital signs are stable.

## 2019-01-24 NOTE — H&P (Addendum)
NAME:  [name], MRN:  [id number], DOB:  April 04, 1929, LOS: 0 ADMISSION DATE:  01/17/2019, CONSULTATION DATE:  01/24/19 REFERRING MD:  Alcario Drought, CHIEF COMPLAINT:  Dyspnea   Brief History   86 y.o. F with PMH of Atrial Fibrillation, HL, HTN and thyroid disease who was brought in for respiratory distress.  Per report, pt lives at home with her son who was recently hospitalized for a week for Covid-19 leaving no one to care for her.  He has been home a few days, but pt developed respiratory distress and was Covid positive in the ED requiring intubation and found to have R pneumo,  History of present illness   [name] is a 86 y.o. F with PMH Atrial fibrillation, aortic valve replacement, HL, HTN and thyroid disease who has been home alone recently while her son was hospitalized with Covid-19 and then developed respiratory distress several days after his return home and was found to be Covid positive.   Pt's son who lives with her reports that at baseline she is minimally able to perform ADL's, after his return she was too weak to sit up and had not been able to take her medications.  Pt's po intake is mostly Ensure at baseline and probably was not eating or drinking while alone.   In the ED, she was hypoxic in the 80's with increasing work of breathing, so was emergently intubated. CXR revealed small basilar R pneumothorax and labs significant for lactic acid 3.9, sodium of 172,  Creatinine 1.7 with K 3.3.  PCCM consulted for admission  Past Medical History    has a past medical history of A-fib [facility]), Aortic valve prosthesis present, Chronic constipation, History of echocardiogram (2008), echocardiogram (2015), Hyperlipidemia, Hypertension, and Thyroid disease.   Significant Hospital Events   1/11 admit to PCCM  Consults:    Procedures:  1/11 ETT 1/11 R chest tube 1/11 R femoral CVC placed by EDP   Significant Diagnostic Tests:  1/11 CXR>>Small basilar right  pneumothorax.  Micro Data:   1/11 BCx2>> 1/11 UCx2>> 1/11 Respiratory culture>>   Antimicrobials:  Ceftriaxone 1/11  Azithromycin 1/11  Interim history/subjective:  Pt intubated and chest tube placed in the ED  Objective   Blood pressure 122/63, pulse 78, temperature (!) 93.4 F (34.1 C), temperature source Bladder, resp. rate (!) 21, height 4\' 10"  (1.473 m), weight 52.2 kg, SpO2 99 %.    Vent Mode: PRVC FiO2 (%):  [100 %] 100 % Set Rate:  [16 bmp] 16 bmp Vt Set:  [420 mL] 420 mL PEEP:  [5 cmH20] 5 cmH20 Plateau Pressure:  [15 cmH20] 15 cmH20   Intake/Output Summary (Last 24 hours) at 01/24/2019 0433 Last data filed at 01/24/2019 0135 Gross per 24 hour  Intake 1000 ml  Output --  Net 1000 ml   Filed Weights   01/24/19 0230  Weight: 52.2 kg    General:  Elderly, frail, malnourished, disheveled F HEENT: MM pink/moist Neuro: awake, responsive to pain  CV: s1s2 rrr, no m/r/g PULM:  ETT in place, bilateral crackles in upper lobes  GI: soft, bsx4 active  Extremities: warm/dry, no edema  Skin: skin dry and flaking   Resolved Hospital Problem list     Assessment & Plan:   Acute hypoxic respiratory failure secondary to R basilar pneumothorax and Covid-19  -Chest tube placed to suction P: -initiate Dexamethasone, Remdesevir, cover for concurrent bacterial infection with  Ceftriaxone  -Follow inflammatory labs -minimal covid PNA on CXR, if not  improving could consider obtaining CTA chest or V/Q to evaluate for PE -Required pressors for a short time in the ED, resume Levophed if needed to keep MAP >30 -Maintain full vent support with SAT/SBT as tolerated -titrate Vent setting to maintain SpO2 greater than or equal to 90%. -HOB elevated 30 degrees. -Plateau pressures less than 30 cm H20.  -Follow chest x-ray, ABG prn.   -Bronchial hygiene and RT/bronchodilator protocol.    Hypernatremia and AKI -Received 3L Normal saline in the ED  -likely secondary to  significant volume depletion -Creatinine 1.7, lase baseline a year ago <1.0 P: -Monitor Na level, goal reduction 35meqs in 24hrs  -monitor for re-feeding syndrome -Monitor UOP and avoid nephrotoxins   Hypothyroidism -check TSH and continue synthroid  Failure to thrive  -prognosis and poor quality of life discussed with Pt's son, code status changed to DNR but continue full supportive care   History of Atrial Fibrillation -in sinus rhythm on arrival and is not anti-coagulated    Best practice:  Diet: npo Pain/Anxiety/Delirium protocol (if indicated): fentanyl gtt VAP protocol (if indicated): yes DVT prophylaxis: heparin GI prophylaxis: protonix Glucose control: n/a Mobility: bed rest Code Status: Code discussed with son and changed to DNR but continue full supportive care Family Communication: Son Adelene Idler updated with plan of care Disposition: ICU  Labs   CBC: Recent Labs  Lab 01/24/19 0008  WBC 10.7*  NEUTROABS 8.6*  HGB 16.3*  HCT 51.9*  MCV 123.9*  PLT 123XX123    Basic Metabolic Panel: Recent Labs  Lab 01/24/19 0008  NA 172*  K 3.3*  CL >130*  CO2 24  GLUCOSE 178*  BUN 121*  CREATININE 1.79*  CALCIUM 8.8*   GFR: Estimated Creatinine Clearance: 15.9 mL/min (A) (by C-G formula based on SCr of 1.79 mg/dL (H)). Recent Labs  Lab 01/24/19 0008 01/24/19 0009 01/24/19 0134  WBC 10.7*  --   --   LATICACIDVEN  --  3.9* 2.9*    Liver Function Tests: Recent Labs  Lab 01/24/19 0008  AST 44*  ALT 21  ALKPHOS 94  BILITOT 3.0*  PROT 7.3  ALBUMIN 2.9*   Recent Labs  Lab 01/24/19 0008  LIPASE 33   No results for input(s): AMMONIA in the last 168 hours.  ABG    Component Value Date/Time   HCO3 28.7 (H) 10/07/2006 0000   TCO2 30 10/07/2006 0000     Coagulation Profile: No results for input(s): INR, PROTIME in the last 168 hours.  Cardiac Enzymes: No results for input(s): CKTOTAL, CKMB, CKMBINDEX, TROPONINI in the last 168  hours.  HbA1C: No results found for: HGBA1C  CBG: No results for input(s): GLUCAP in the last 168 hours.  Review of Systems:   Negative except as noted in HPI  Past Medical History  She,  has a past medical history of A-fib [facility]), Aortic valve prosthesis present, Chronic constipation, History of echocardiogram (2008), echocardiogram (2015), Hyperlipidemia, Hypertension, and Thyroid disease.   Surgical History    Past Surgical History:  Procedure Laterality Date  . COLONOSCOPY N/A 08/26/2013   Procedure: COLONOSCOPY;  Surgeon: Beryle Beams, MD;  Location: [facility];  Service: Endoscopy;  Laterality: N/A;     Social History   reports that she has never smoked. She has never used smokeless tobacco. She reports that she does not drink alcohol or use drugs.   Family History   Her family history includes Heart attack in her mother; Heart disease in her father and mother;  Hypertension in her mother. There is no history of Stroke.   Allergies Allergies  Allergen Reactions  . Bactrim [Sulfamethoxazole-Trimethoprim] Other (See Comments)    Unknown reaction  . Celebrex [Celecoxib] Other (See Comments)    Unknown reaction  . Codeine Nausea And Vomiting     Home Medications  Prior to Admission medications   Medication Sig Start Date End Date Taking? Authorizing Provider  aspirin EC 81 MG tablet Take 81 mg by mouth daily.    [provider]  atorvastatin (LIPITOR) 40 MG tablet Take 1 tablet (40 mg total) by mouth daily at 6 PM. Patient taking differently: Take 40 mg by mouth at bedtime.  04/24/14   Richardson Dopp T, PA-C  augmented betamethasone dipropionate (DIPROLENE-AF) 0.05 % ointment Apply 1 application topically daily as needed (eczema).     [provider]  levothyroxine (SYNTHROID, LEVOTHROID) 50 MCG tablet Take 1 tablet (50 mcg total) by mouth daily. 09/07/17   Ghimire, Henreitta Leber, MD  metoprolol (LOPRESSOR) 50 MG tablet Take 25 mg by mouth 2 (two) times  daily.     [provider]  polyethylene glycol (MIRALAX / GLYCOLAX) packet Take 17 g by mouth daily. 09/07/17   Ghimire, Henreitta Leber, MD  senna (SENOKOT) 8.6 MG TABS tablet Take 2 tablets (17.2 mg total) by mouth at bedtime as needed for mild constipation (if no BM inspite of Miralax). 09/07/17   Jonetta Osgood, MD     Critical care time: 70 minutes   CRITICAL CARE Performed by: Otilio Carpen Nylan Nevel   Total critical care time: 70 minutes  Critical care time was exclusive of separately billable procedures and treating other patients.  Critical care was necessary to treat or prevent imminent or life-threatening deterioration.  Critical care was time spent personally by me on the following activities: development of treatment plan with patient and/or surrogate as well as nursing, discussions with consultants, evaluation of patient's response to treatment, examination of patient, obtaining history from patient or surrogate, ordering and performing treatments and interventions, ordering and review of laboratory studies, ordering and review of radiographic studies, pulse oximetry and re-evaluation of patient's condition.   Otilio Carpen Dhanush Jokerst, PA-C [place] [facility]  Pager# [phone], if no answer [phone]

## 2019-01-24 NOTE — ED Notes (Signed)
RT unable to obtain ABG. Dr. Roxanne Mins aware.

## 2019-01-24 NOTE — ED Notes (Signed)
OG attempted x2 by 2 RN's. unsuccessful

## 2019-01-24 NOTE — Progress Notes (Signed)
RT note: patient does not meet SBT criteria for this AM due to FIO2 requirements.  Currently tolerating current ventilator settings well.  Will continue to monitor.

## 2019-01-24 NOTE — ED Notes (Signed)
Bearhugger placed. Temperature 92.3 MD aware

## 2019-01-24 NOTE — Procedures (Signed)
Chest Tube Insertion Procedure Note  Indications:  Clinically significant Pneumothorax  Pre-operative Diagnosis: Pneumothorax  Post-operative Diagnosis: Pneumothorax  Procedure Details  Informed consent was obtained for the procedure, including sedation.  Risks of lung perforation, hemorrhage, arrhythmia, and adverse drug reaction were discussed.   After sterile skin prep, using standard technique, a 14 French tube was placed in the right anterior 5th rib space.  Findings: R basilar PTX  Estimated Blood Loss:  Minimal         Specimens:  None              Complications:  None; patient tolerated the procedure well.         Disposition: ICU - intubated and critically ill.         Condition: stable  Attending Attestation: I performed the procedure.  Left to suction -20

## 2019-01-24 NOTE — Progress Notes (Signed)
NAME:  [name], MRN:  [id number], DOB:  [birth year], LOS: 0 ADMISSION DATE:  01/14/2019, CONSULTATION DATE:  01/24/19 REFERRING MD:  Alcario Drought, CHIEF COMPLAINT:  Dyspnea   Brief History   86 y.o. F with PMH of Atrial Fibrillation, HL, HTN and thyroid disease who was brought in for respiratory distress.  Per report, pt lives at home with her son who was recently hospitalized for a week for Covid-19 leaving no one to care for her.  He has been home a few days, but pt developed respiratory distress and was Covid positive in the ED requiring intubation and found to have R pneumo,  History of present illness   [name] is a 86 y.o. F with PMH Atrial fibrillation, aortic valve replacement, HL, HTN and thyroid disease who has been home alone recently while her son was hospitalized with Covid-19 and then developed respiratory distress several days after his return home and was found to be Covid positive.   Pt's son who lives with her reports that at baseline she is minimally able to perform ADL's, after his return she was too weak to sit up and had not been able to take her medications.  Pt's po intake is mostly Ensure at baseline and probably was not eating or drinking while alone.   In the ED, she was hypoxic in the 80's with increasing work of breathing, so was emergently intubated. CXR revealed small basilar R pneumothorax and labs significant for lactic acid 3.9, sodium of 172,  Creatinine 1.7 with K 3.3.  PCCM consulted for admission  Past Medical History    has a past medical history of A-fib [facility]), Aortic valve prosthesis present, Chronic constipation, History of echocardiogram (2008), echocardiogram (2015), Hyperlipidemia, Hypertension, and Thyroid disease.   Significant Hospital Events   1/11 admit to PCCM  Consults:    Procedures:  1/11 ETT 1/11 R chest tube 1/11 R femoral CVC placed by EDP   Significant Diagnostic Tests:  1/11 CXR>>Small basilar right  pneumothorax.  Micro Data:   1/11 BCx2>> 1/11 UCx2>> 1/11 Respiratory culture>>   Antimicrobials:  Ceftriaxone 1/11  Azithromycin 1/11  Interim history/subjective:  No events since admission to the ICU  Objective   Blood pressure (!) 84/46, pulse 89, temperature (!) 97.2 F (36.2 C), resp. rate 15, height 4\' 10"  (1.473 m), weight 52.2 kg, SpO2 100 %.    Vent Mode: PRVC FiO2 (%):  [60 %-100 %] 60 % Set Rate:  [16 bmp] 16 bmp Vt Set:  [420 mL] 420 mL PEEP:  [5 cmH20] 5 cmH20 Plateau Pressure:  [14 cmH20-15 cmH20] 14 cmH20   Intake/Output Summary (Last 24 hours) at 01/24/2019 1208 Last data filed at 01/24/2019 1100 Gross per 24 hour  Intake 1956.13 ml  Output 160 ml  Net 1796.13 ml   Filed Weights   01/24/19 0230  Weight: 52.2 kg   General:  Elderly acutely ill appearing female, sedate, NAD HEENT: [place]/AT, PERRL, EOM-I and DMM Neuro: Sedate, moving all ext to pain CV: RRR, Nl S1/S2 and -M/R/G PULM:  Coarse BS diffusely GI: Soft, NT, ND and +BS Extremities: warm/dry, no edema  Skin: skin dry and flaking  I reviewed CXR myself, ETT is in a good position  Discussed with RN and RT bedside  Resolved Hospital Problem list     Assessment & Plan:   Acute hypoxic respiratory failure secondary to R basilar pneumothorax and Covid-19  -Chest tube placed to suction P: - Continue Dexamethasone, Remdesevir, cover for  concurrent bacterial infection with  Ceftriaxone  - Follow inflammatory labs - Required pressors for a short time in the ED, resume Levophed if needed to keep MAP >30 - Full vent support - HOB elevated 30 degrees. - Plateau pressures less than 30 cm H20.  - Follow chest x-ray, ABG prn.   - Bronchial hygiene and RT/bronchodilator protocol. - Titrate O2 for sat of 88-90% at most  Hypernatremia and AKI -Received 3L Normal saline in the ED  -likely secondary to significant volume depletion -Creatinine 1.7, lase baseline a year ago <1.0 P: - Monitor Na  level, goal reduction 41meqs in 24hrs  - Monitor for re-feeding syndrome - Monitor UOP and avoid nephrotoxins - Replace electrolytes as indicated  Hypothyroidism - check TSH and continue synthroid  Failure to thrive  - Prognosis and poor quality of life discussed with Pt's son, code status changed to DNR but continue full supportive care   History of Atrial Fibrillation - In sinus rhythm on arrival and is not anti-coagulated   Will allow for a few days to address metabolic concerns, if no improvement will likely go down the palliative route as prognosis is very poor  Best practice:  Diet: npo Pain/Anxiety/Delirium protocol (if indicated): fentanyl gtt VAP protocol (if indicated): yes DVT prophylaxis: heparin GI prophylaxis: protonix Glucose control: n/a Mobility: bed rest Code Status: Code discussed with son and changed to DNR but continue full supportive care Family Communication: Son Adelene Idler updated with plan of care Disposition: ICU  Labs   CBC: Recent Labs  Lab 01/24/19 0008 01/24/19 0445  WBC 10.7* 7.8  NEUTROABS 8.6*  --   HGB 16.3* 8.6*  HCT 51.9* 28.3*  MCV 123.9* 126.9*  PLT 187 94*    Basic Metabolic Panel: Recent Labs  Lab 01/24/19 0008 01/24/19 0500 01/24/19 0613 01/24/19 0905  NA 172* 165* 161* 163*  K 3.3* 3.1* 2.5* 2.9*  CL >130* >130* >130* >130*  CO2 24 19* 20* 21*  GLUCOSE 178* 214* 188* 166*  BUN 121* 93* 87* 97*  CREATININE 1.79* 1.39* 1.25* 1.53*  CALCIUM 8.8* 6.3* 6.0* 7.0*  MG  --  2.3  --   --   PHOS  --  3.6  --   --    GFR: Estimated Creatinine Clearance: 18.6 mL/min (A) (by C-G formula based on SCr of 1.53 mg/dL (H)). Recent Labs  Lab 01/24/19 0008 01/24/19 0009 01/24/19 0134 01/24/19 0445  PROCALCITON  --   --  0.15  --   WBC 10.7*  --   --  7.8  LATICACIDVEN  --  3.9* 2.9*  --     Liver Function Tests: Recent Labs  Lab 01/24/19 0008 01/24/19 0905  AST 44* 37  ALT 21 17  ALKPHOS 94 73  BILITOT 3.0* 1.7*   PROT 7.3 5.4*  ALBUMIN 2.9* 2.1*   Recent Labs  Lab 01/24/19 0008  LIPASE 33   No results for input(s): AMMONIA in the last 168 hours.  ABG    Component Value Date/Time   HCO3 28.7 (H) 10/07/2006 0000   TCO2 30 10/07/2006 0000     Coagulation Profile: Recent Labs  Lab 01/24/19 0437  INR 1.3*    Cardiac Enzymes: Recent Labs  Lab 01/24/19 0437  CKTOTAL 107    HbA1C: Hgb A1c MFr Bld  Date/Time Value Ref Range Status  01/24/2019 04:45 AM 5.5 4.8 - 5.6 % Final    Comment:    (NOTE) Pre diabetes:  5.7%-6.4% Diabetes:              >6.4% Glycemic control for   <7.0% adults with diabetes     CBG: Recent Labs  Lab 01/24/19 0450 01/24/19 0636 01/24/19 0803 01/24/19 1145  GLUCAP 151* 146* 168* 120*   The patient is critically ill with multiple organ systems failure and requires high complexity decision making for assessment and support, frequent evaluation and titration of therapies, application of advanced monitoring technologies and extensive interpretation of multiple databases.   Critical Care Time devoted to patient care services described in this note is  35  Minutes. This time reflects time of care of this signee Dr Jennet Maduro. This critical care time does not reflect procedure time, or teaching time or supervisory time of PA/NP/Med student/Med Resident etc but could involve care discussion time.  Rush Farmer, M.D. [facility] Pulmonary/Critical Care Medicine.

## 2019-01-24 NOTE — ED Notes (Signed)
ED TO INPATIENT HANDOFF REPORT  ED Nurse Name and Phone #: [phone]   S Name/Age/Gender [name] 86 y.o. female Room/Bed: 034C/034C  Code Status   Code Status: Partial Code  Home/SNF/Other Home AAO x0 Is this baseline? No   Triage Complete: Triage complete  Chief Complaint Acute hypoxemic respiratory failure ([place]) [J96.01]  Triage Note Pt presents to ED from home BIB [facility]. EMS called out for resp complaint Pt found at home not being take care of for multiple days d/t son being in hospital. Pt GCS of 4. No purposeful movement, moans from pain. Pt AAO x0. Pt recently exposed to [place].    Allergies Allergies  Allergen Reactions  . Bactrim [Sulfamethoxazole-Trimethoprim] Other (See Comments)    Unknown reaction  . Celebrex [Celecoxib] Other (See Comments)    Unknown reaction  . Codeine Nausea And Vomiting    Level of Care/Admitting Diagnosis ED Disposition    ED Disposition Condition Comment   Admit  Hospital Area: [facility] [[id number]]  Level of Care: ICU [6]  Covid Evaluation: Confirmed COVID Positive  Diagnosis: Acute hypoxemic respiratory failure [facility]) [[id number]]  Admitting Physician: Maryjane Hurter [[id number]]  Attending Physician: Maryjane Hurter [phone]  Estimated length of stay: 5 - 7 days  Certification:: I certify this patient will need inpatient services for at least 2 midnights       B Medical/Surgery History Past Medical History:  Diagnosis Date  . A-fib (HCC)    perioperative, no recurrence  . Aortic valve prosthesis present   . Chronic constipation   . History of echocardiogram 2008   intact LVEF, 65% nl AVR fxn, mod bileaflet MV thickening  . Hx of echocardiogram 2015   Echo (04/2013): Focal basal hypertrophy, EF 50-55%, normal wall motion, mild MR, PASP 33 mm Hg, AVR okay (mean gradient 6 mmHg)  . Hyperlipidemia   . Hypertension   . Thyroid disease    hypothyroidism    Past Surgical History:  Procedure  Laterality Date  . COLONOSCOPY N/A 08/26/2013   Procedure: COLONOSCOPY;  Surgeon: Beryle Beams, MD;  Location: [facility] [place];  Service: Endoscopy;  Laterality: N/A;     A IV Location/Drains/Wounds Patient Lines/Drains/Airways Status   Active Line/Drains/Airways    Name:   Placement date:   Placement time:   Site:   Days:   Peripheral IV 01/18/2019 Right Antecubital   01/30/2019    2340    Antecubital   1   CVC Triple Lumen 01/28/2019 Right Femoral 20 cm   01/18/2019    2356     1   Urethral Catheter Jovann Luse F., RN Temperature probe   01/24/19    0022    Temperature probe   less than 1   Airway 7.5 mm   01/24/19    0225     less than 1   Pressure Ulcer 08/25/13 Stage II -  Partial thickness loss of dermis presenting as a shallow open ulcer with a red, pink wound bed without slough. reddened with open skin in middle. small amount of bleeding noted; this is NOT a pressure ulcer, it is a pa   08/25/13    0200     1978   Pressure Injury 09/06/17 Stage I -  Intact skin with non-blanchable redness of a localized area usually over a bony prominence. red   09/06/17    2300     505          Intake/Output Last 24 hours  Intake/Output Summary (Last 24 hours) at 01/24/2019 0529 Last data filed at 01/24/2019 0135 Gross per 24 hour  Intake 1000 ml  Output --  Net 1000 ml    Labs/Imaging Results for orders placed or performed during the hospital encounter of 01/15/2019 (from the past 48 hour(s))  Comprehensive metabolic panel     Status: Abnormal   Collection Time: 01/24/19 12:08 AM  Result Value Ref Range   Sodium 172 (HH) 135 - 145 mmol/L    Comment: CRITICAL RESULT CALLED TO, READ BACK BY AND VERIFIED WITH: M [facility] 01/24/2019 0146 WILDERK    Potassium 3.3 (L) 3.5 - 5.1 mmol/L   Chloride >130 (HH) 98 - 111 mmol/L    Comment: CRITICAL RESULT CALLED TO, READ BACK BY AND VERIFIED WITH: M FOLERS,RN 01/24/2019 0146 WILDERK    CO2 24 22 - 32 mmol/L   Glucose, Bld 178 (H) 70 - 99 mg/dL   BUN 121  (H) 8 - 23 mg/dL   Creatinine, Ser 1.79 (H) 0.44 - 1.00 mg/dL   Calcium 8.8 (L) 8.9 - 10.3 mg/dL   Total Protein 7.3 6.5 - 8.1 g/dL   Albumin 2.9 (L) 3.5 - 5.0 g/dL   AST 44 (H) 15 - 41 U/L   ALT 21 0 - 44 U/L   Alkaline Phosphatase 94 38 - 126 U/L   Total Bilirubin 3.0 (H) 0.3 - 1.2 mg/dL   GFR calc non Af Amer 25 (L) >60 mL/min   GFR calc Af Amer 29 (L) >60 mL/min   Anion gap NOT CALCULATED 5 - 15    Comment: Performed at [facility], [address] [place], [place] [address]  CBC with Differential     Status: Abnormal   Collection Time: 01/24/19 12:08 AM  Result Value Ref Range   WBC 10.7 (H) 4.0 - 10.5 K/uL   RBC 4.19 3.87 - 5.11 MIL/uL   Hemoglobin 16.3 (H) 12.0 - 15.0 g/dL   HCT 51.9 (H) 36.0 - 46.0 %   MCV 123.9 (H) 80.0 - 100.0 fL   MCH 38.9 (H) 26.0 - 34.0 pg   MCHC 31.4 30.0 - 36.0 g/dL   RDW 13.6 11.5 - 15.5 %   Platelets 187 150 - 400 K/uL   nRBC 0.8 (H) 0.0 - 0.2 %   Neutrophils Relative % 82 %   Neutro Abs 8.6 (H) 1.7 - 7.7 K/uL   Lymphocytes Relative 13 %   Lymphs Abs 1.3 0.7 - 4.0 K/uL   Monocytes Relative 5 %   Monocytes Absolute 0.5 0.1 - 1.0 K/uL   Eosinophils Relative 0 %   Eosinophils Absolute 0.0 0.0 - 0.5 K/uL   Basophils Relative 0 %   Basophils Absolute 0.0 0.0 - 0.1 K/uL   Immature Granulocytes 0 %   Abs Immature Granulocytes 0.04 0.00 - 0.07 K/uL    Comment: Performed at [facility], [place] [address], [place], [place] [address]  Lipase, blood     Status: None   Collection Time: 01/24/19 12:08 AM  Result Value Ref Range   Lipase 33 11 - 51 U/L    Comment: Performed at [facility] [address], [place], [place] [address]  Troponin I (High Sensitivity)     Status: Abnormal   Collection Time: 01/24/19 12:08 AM  Result Value Ref Range   Troponin I (High Sensitivity) 240 (HH) <18 ng/L    Comment: CRITICAL RESULT CALLED TO, READ BACK BY AND VERIFIED WITH: Gaetano Net  01/24/2019 0147 WILDERK (NOTE) Elevated high sensitivity  troponin I (hsTnI) values and significant  changes across serial measurements may suggest ACS but many other  chronic and acute conditions are known to elevate hsTnI results.  Refer to the Links section for chest pain algorithms and additional  guidance. Performed at [facility], [place] [address] [place], [place] [address]   Urinalysis, Routine w reflex microscopic     Status: Abnormal   Collection Time: 01/24/19 12:09 AM  Result Value Ref Range   Color, Urine AMBER (A) YELLOW    Comment: BIOCHEMICALS MAY BE AFFECTED BY COLOR   APPearance HAZY (A) CLEAR   Specific Gravity, Urine 1.016 1.005 - 1.030   pH 5.0 5.0 - 8.0   Glucose, UA NEGATIVE NEGATIVE mg/dL   Hgb urine dipstick LARGE (A) NEGATIVE   Bilirubin Urine NEGATIVE NEGATIVE   Ketones, ur NEGATIVE NEGATIVE mg/dL   Protein, ur NEGATIVE NEGATIVE mg/dL   Nitrite NEGATIVE NEGATIVE   Leukocytes,Ua NEGATIVE NEGATIVE   RBC / HPF 21-50 0 - 5 RBC/hpf   WBC, UA 0-5 0 - 5 WBC/hpf   Bacteria, UA NONE SEEN NONE SEEN   Squamous Epithelial / LPF 0-5 0 - 5   Mucus PRESENT    Hyaline Casts, UA PRESENT     Comment: Performed at [facility], [place] [address], [place], [place] [address]  Lactic acid, plasma     Status: Abnormal   Collection Time: 01/24/19 12:09 AM  Result Value Ref Range   Lactic Acid, Venous 3.9 (HH) 0.5 - 1.9 mmol/L    Comment: CRITICAL RESULT CALLED TO, READ BACK BY AND VERIFIED WITH: Gaetano Net 01/24/2019 0144 [place] Performed at [facility], [place] [address], [place], [place] [address]   POC SARS Coronavirus 2 Ag-ED - Nasal Swab (BD Veritor Kit)     Status: Abnormal   Collection Time: 01/24/19 12:47 AM  Result Value Ref Range   SARS Coronavirus 2 Ag POSITIVE (A) NEGATIVE    Comment: (NOTE) SARS-CoV-2 antigen PRESENT. Positive results indicate the presence of viral antigens, but clinical correlation with patient history and other diagnostic information is necessary to determine patient infection  status.  Positive results do not rule out bacterial infection or co-infection  with other viruses. False positive results are rare but can occur, and confirmatory RT-PCR testing may be appropriate in some circumstances. The expected result is Negative. Fact Sheet for Patients: PodPark.tn Fact Sheet for Providers: GiftContent.is  This test is not yet approved or cleared by the [place] FDA and  has been authorized for detection and/or diagnosis of SARS-CoV-2 by FDA under an Emergency Use Authorization (EUA).  This EUA will remain in effect (meaning this test can be used) for the duration of  the COVID-19 declaration under Section 564(b)(1) of the Act, 21 U.S.C. section 360bbb-3(b)(1), unless the a uthorization is terminated or revoked sooner.   Lactic acid, plasma     Status: Abnormal   Collection Time: 01/24/19  1:34 AM  Result Value Ref Range   Lactic Acid, Venous 2.9 (HH) 0.5 - 1.9 mmol/L    Comment: CRITICAL VALUE NOTED.  VALUE IS CONSISTENT WITH PREVIOUSLY REPORTED AND CALLED VALUE. Performed at [facility], [place] [address] [place], [place] [address]   D-dimer, quantitative     Status: Abnormal   Collection Time: 01/24/19  1:34 AM  Result Value Ref Range   D-Dimer, Quant 6.83 (H) 0.00 - 0.50 ug/mL-FEU    Comment: (  NOTE) At the manufacturer cut-off of 0.50 ug/mL FEU, this assay has been documented to exclude PE with a sensitivity and negative predictive value of 97 to 99%.  At this time, this assay has not been approved by the FDA to exclude DVT/VTE. Results should be correlated with clinical presentation. Performed at [facility], [place] [address], [place], [place] [address]   Ferritin (Iron Binding Protein)     Status: Abnormal   Collection Time: 01/24/19  1:34 AM  Result Value Ref Range   Ferritin 648 (H) 11 - 307 ng/mL    Comment: Performed at [facility], [place] [address] [place],  [place] [address]  Lactate dehydrogenase     Status: Abnormal   Collection Time: 01/24/19  1:34 AM  Result Value Ref Range   LDH 396 (H) 98 - 192 U/L    Comment: Performed at [facility], [place] [address], [place], [place] [address]  C-reactive protein     Status: Abnormal   Collection Time: 01/24/19  1:34 AM  Result Value Ref Range   CRP 9.4 (H) <1.0 mg/dL    Comment: Performed at [facility] [address], [place], [place] [address]  Procalcitonin     Status: None   Collection Time: 01/24/19  1:34 AM  Result Value Ref Range   Procalcitonin 0.15 ng/mL    Comment:        Interpretation: PCT (Procalcitonin) <= 0.5 ng/mL: Systemic infection (sepsis) is not likely. Local bacterial infection is possible. (NOTE)       Sepsis PCT Algorithm           Lower Respiratory Tract                                      Infection PCT Algorithm    ----------------------------     ----------------------------         PCT < 0.25 ng/mL                PCT < 0.10 ng/mL         Strongly encourage             Strongly discourage   discontinuation of antibiotics    initiation of antibiotics    ----------------------------     -----------------------------       PCT 0.25 - 0.50 ng/mL            PCT 0.10 - 0.25 ng/mL               OR       >80% decrease in PCT            Discourage initiation of                                            antibiotics      Encourage discontinuation           of antibiotics    ----------------------------     -----------------------------         PCT >= 0.50 ng/mL              PCT 0.26 - 0.50 ng/mL               AND        <  80% decrease in PCT             Encourage initiation of                                             antibiotics       Encourage continuation           of antibiotics    ----------------------------     -----------------------------        PCT >= 0.50 ng/mL                  PCT > 0.50 ng/mL               AND         increase in PCT                   Strongly encourage                                      initiation of antibiotics    Strongly encourage escalation           of antibiotics                                     -----------------------------                                           PCT <= 0.25 ng/mL                                                 OR                                        > 80% decrease in PCT                                     Discontinue / Do not initiate                                             antibiotics Performed at [facility], [address] [place], [place] [address]   Fibrinogen     Status: Abnormal   Collection Time: 01/24/19  1:34 AM  Result Value Ref Range   Fibrinogen 542 (H) 210 - 475 mg/dL    Comment: Performed at [facility] [address], [place], [place] [address]  Type and screen [place]     Status: None   Collection Time: 01/24/19  1:50 AM  Result Value Ref Range   ABO/RH(D) O POS    Antibody Screen NEG    Sample Expiration  01/27/2019,2359 Performed at [facility] [address], [place], [place] [address]   Troponin I (High Sensitivity)     Status: Abnormal   Collection Time: 01/24/19  2:02 AM  Result Value Ref Range   Troponin I (High Sensitivity) 218 (HH) <18 ng/L    Comment: CRITICAL VALUE NOTED.  VALUE IS CONSISTENT WITH PREVIOUSLY REPORTED AND CALLED VALUE. (NOTE) Elevated high sensitivity troponin I (hsTnI) values and significant  changes across serial measurements may suggest ACS but many other  chronic and acute conditions are known to elevate hsTnI results.  Refer to the Links section for chest pain algorithms and additional  guidance. Performed at [facility], [place] [address], [place], [place] [address]   CBG monitoring, ED     Status: Abnormal   Collection Time: 01/24/19  4:50 AM  Result Value Ref Range   Glucose-Capillary 151 (H) 70 - 99 mg/dL   DG CHEST PORT 1 VIEW  Result Date:  01/24/2019 CLINICAL DATA:  Chest tube placement EXAM: PORTABLE CHEST 1 VIEW COMPARISON:  January 24, 2019 at 3:11 a.m. FINDINGS: The heart size and mediastinal contours are unchanged. Overlying median sternotomy wires. ETT is seen 2 cm above the carina. Interval placement of right-sided pigtail catheter. No residual right-sided pneumothorax is noted. The left lung is clear. No acute osseous abnormality. IMPRESSION: Interval placement of right pigtail catheter. No definite residual pneumothorax. Electronically Signed   By: Prudencio Pair M.D.   On: 01/24/2019 04:43   DG Chest Port 1 View  Result Date: 01/24/2019 CLINICAL DATA:  Possible free air seen on post intubation film EXAM: PORTABLE CHEST 1 VIEW COMPARISON:  January 24, 2019 FINDINGS: Single lateral decubitus film shows a small to moderate right pneumothorax. No free air seen under the hemidiaphragm. Surgical clips in right upper quadrant. IMPRESSION: Small basilar right pneumothorax. These results were called by telephone at the time of interpretation on 01/24/2019 at 3:38 am to provider DAVID [facility] , who verbally acknowledged these results. Electronically Signed   By: Prudencio Pair M.D.   On: 01/24/2019 03:38   DG Chest Portable 1 View  Result Date: 01/24/2019 CLINICAL DATA:  86 year old female intubated, enteric tube placement. Found down at home. Positive COVID-19. EXAM: PORTABLE CHEST 1 VIEW COMPARISON:  0021 hours today. FINDINGS: Portable AP supine view at 0229 hours. New abnormal lucency in the right upper quadrant projecting over the dome of the right hemidiaphragm. No pleural edge is visible. Other visible bowel gas pattern appears normal. Intubated with endotracheal tube tip in good position at the level the clavicles. No enteric tube identified. Mediastinal contours remain normal. Stable ventilation with only mild nonspecific streaky bilateral perihilar opacity. No acute osseous abnormality identified. IMPRESSION: 1. New large area of abnormal  lucency projecting over the right upper quadrant may be pneumothorax, or perhaps pneumoperitoneum. A portable left-side-down lateral decubitus x-ray may be the quickest way to determine. 2. Endotracheal tube tip in good position. No enteric tube identified. 3. Lungs appear clear aside from mild nonspecific perihilar opacity. Study discussed by telephone with Dr. Delora Fuel on 0/98/1191 at 02:52 . Electronically Signed   By: Genevie Ann M.D.   On: 01/24/2019 02:57   DG Chest Port 1 View  Result Date: 01/24/2019 CLINICAL DATA:  Altered mental status EXAM: PORTABLE CHEST 1 VIEW COMPARISON:  April 24, 2013 FINDINGS: The heart size and mediastinal contours are unchanged. Aortic knob calcifications. Overlying median sternotomy wires. There is subpleural thickening seen within the periphery of  the right mid lung. No large airspace consolidation or pleural effusion. No acute osseous abnormality. IMPRESSION: No active disease. Electronically Signed   By: Prudencio Pair M.D.   On: 01/24/2019 00:38    Pending Labs Unresulted Labs (From admission, onward)    Start     Ordered   01/24/19 0527  Hepatic function panel  Add-on,   AD     01/24/19 0526   01/24/19 0500  CBC  Tomorrow morning,   R     01/24/19 0442   01/24/19 6606  Basic metabolic panel  Now then every 6 hours,   R (with STAT occurrences)     01/24/19 0442   01/24/19 0500  Magnesium  Tomorrow morning,   R     01/24/19 0442   01/24/19 0500  Phosphorus  Tomorrow morning,   R     01/24/19 0442   01/24/19 0447  Culture, blood (Routine X 2) w Reflex to ID Panel  BLOOD CULTURE X 2,   R (with STAT occurrences)     01/24/19 0447   01/24/19 0441  Protime-INR  Once,   STAT     01/24/19 0442   01/24/19 0441  CK  Once,   STAT     01/24/19 0442   01/24/19 0440  Culture, respiratory (tracheal aspirate)  Once,   STAT     01/24/19 0442   01/24/19 0440  Blood gas, venous  Once,   R     01/24/19 0442   01/24/19 0437  TSH  Once,   STAT     01/24/19 0442    01/24/19 0437  T4, free  Once,   STAT     01/24/19 0442   01/24/19 3016  Basic metabolic panel  Now then every 4 hours,   R (with STAT occurrences)     01/24/19 0211   01/24/19 0212  Hemoglobin A1c  Once,   STAT    Comments: To assess prior glycemic control    01/24/19 0211   01/24/19 0009  Urine culture  ONCE - STAT,   STAT     01/24/19 0009          Vitals/Pain Today's Vitals   01/24/19 0457 01/24/19 0500 01/24/19 0504 01/24/19 0515  BP: (!) 119/56 (!) 121/55  121/60  Pulse: 81 81  84  Resp: 16 16  17   Temp:   (!) 94.5 F (34.7 C)   TempSrc:   Bladder   SpO2: 92% 93%  96%  Weight:      Height:        Isolation Precautions Airborne and Contact precautions  Medications Medications  dextrose 5 % solution ( Intravenous New Bag/Given 01/24/19 0249)  potassium chloride 10 mEq in 100 mL IVPB (10 mEq Intravenous New Bag/Given 01/24/19 0443)  insulin aspart (novoLOG) injection 0-9 Units (2 Units Subcutaneous Given 01/24/19 0513)  norepinephrine (LEVOPHED) 553m in 2538mpremix infusion ( Intravenous Not Given 01/24/19 0245)  norepinephrine (LEVOPHED) 53m70mn 250m69memix infusion (0 mcg/min Intravenous Hold 01/24/19 0249)  heparin injection 5,000 Units (has no administration in time range)  famotidine (PEPCID) IVPB 20 mg premix (has no administration in time range)  thiamine (B-1) injection 100 mg (has no administration in time range)  cefTRIAXone (ROCEPHIN) 2 g in sodium chloride 0.9 % 100 mL IVPB (has no administration in time range)  fentaNYL (SUBLIMAZE) injection 25 mcg (has no administration in time range)  fentaNYL (SUBLIMAZE) injection 25-100 mcg (has no administration in time range)  fentaNYL (SUBLIMAZE) injection 25 mcg (has no administration in time range)  fentaNYL 2555mg in NS 2562m(1031mml) infusion-PREMIX (25 mcg/hr Intravenous New Bag/Given 01/24/19 0512)  fentaNYL (SUBLIMAZE) bolus via infusion 25 mcg (has no administration in time range)  sennosides (SENOKOT) 8.8  MG/5ML syrup 5 mL (has no administration in time range)  bisacodyl (DULCOLAX) suppository 10 mg (has no administration in time range)  sodium chloride 0.9 % bolus 1,000 mL (0 mLs Intravenous Stopped 01/24/19 0135)  sodium chloride 0.9 % bolus 1,000 mL (1,000 mLs Intravenous New Bag/Given 01/24/19 0210)  potassium chloride 10 mEq in 100 mL IVPB (0 mEq Intravenous Stopped 01/24/19 0408)  sodium chloride 0.9 % bolus 1,000 mL (1,000 mLs Intravenous New Bag/Given 01/24/19 0244)  etomidate (AMIDATE) injection 20 mg (20 mg Intravenous Given by Other 01/24/19 0230)  succinylcholine (ANECTINE) injection 75 mg (75 mg Intravenous Given by Other 01/24/19 0230)  fentaNYL (SUBLIMAZE) injection 100 mcg (100 mcg Intravenous Given 01/24/19 0350)    Mobility non-ambulatory High fall risk   Focused Assessments Cardiac Assessment Handoff:    Lab Results  Component Value Date   CKTOTAL 42 04/30/2007   CKMB 4.6 (H) 04/30/2007   TROPONINI <0.30 08/25/2013   Lab Results  Component Value Date   DDIMER 6.83 (H) 01/24/2019   Does the Patient currently have chest pain? No  , Pulmonary Assessment Handoff:  Lung sounds: Bilateral Breath Sounds: Diminished L Breath Sounds: Diminished R Breath Sounds: Diminished O2 Device: Ventilator O2 Flow Rate (L/min): 15 L/min      R Recommendations: See Admitting Provider Note  Report given to:   Additional Notes:

## 2019-01-24 NOTE — Progress Notes (Signed)
Notified Elink about critical lab values: Chloride >130, Calcium 6.3, Sodium 165.

## 2019-01-24 NOTE — Progress Notes (Signed)
RT attempted to draw arterial blood gas but was unsuccessful. RN aware.

## 2019-01-25 ENCOUNTER — Inpatient Hospital Stay (HOSPITAL_COMMUNITY): Payer: Medicare Other

## 2019-01-25 DIAGNOSIS — E870 Hyperosmolality and hypernatremia: Secondary | ICD-10-CM

## 2019-01-25 DIAGNOSIS — G9340 Encephalopathy, unspecified: Secondary | ICD-10-CM

## 2019-01-25 DIAGNOSIS — N179 Acute kidney failure, unspecified: Secondary | ICD-10-CM

## 2019-01-25 LAB — BASIC METABOLIC PANEL
Anion gap: 7 (ref 5–15)
BUN: 82 mg/dL — ABNORMAL HIGH (ref 8–23)
CO2: 19 mmol/L — ABNORMAL LOW (ref 22–32)
Calcium: 6.6 mg/dL — ABNORMAL LOW (ref 8.9–10.3)
Chloride: 129 mmol/L — ABNORMAL HIGH (ref 98–111)
Creatinine, Ser: 1.22 mg/dL — ABNORMAL HIGH (ref 0.44–1.00)
GFR calc Af Amer: 46 mL/min — ABNORMAL LOW (ref >60)
GFR calc non Af Amer: 40 mL/min — ABNORMAL LOW (ref >60)
Glucose, Bld: 251 mg/dL — ABNORMAL HIGH (ref 70–99)
Potassium: 3.6 mmol/L (ref 3.5–5.1)
Sodium: 155 mmol/L — ABNORMAL HIGH (ref 135–145)

## 2019-01-25 LAB — POCT I-STAT 7, (LYTES, BLD GAS, ICA,H+H)
Acid-base deficit: 7 mmol/L — ABNORMAL HIGH (ref 0.0–2.0)
Bicarbonate: 17.7 mmol/L — ABNORMAL LOW (ref 20.0–28.0)
Calcium, Ion: 1.02 mmol/L — ABNORMAL LOW (ref 1.15–1.40)
HCT: 31 % — ABNORMAL LOW (ref 36.0–46.0)
Hemoglobin: 10.5 g/dL — ABNORMAL LOW (ref 12.0–15.0)
O2 Saturation: 98 %
Patient temperature: 98.4
Potassium: 3.6 mmol/L (ref 3.5–5.1)
Sodium: 159 mmol/L — ABNORMAL HIGH (ref 135–145)
TCO2: 19 mmol/L — ABNORMAL LOW (ref 22–32)
pCO2 arterial: 31.1 mmHg — ABNORMAL LOW (ref 32.0–48.0)
pH, Arterial: 7.364 (ref 7.350–7.450)
pO2, Arterial: 102 mmHg (ref 83.0–108.0)

## 2019-01-25 LAB — CBC
HCT: 34.7 % — ABNORMAL LOW (ref 36.0–46.0)
Hemoglobin: 10.7 g/dL — ABNORMAL LOW (ref 12.0–15.0)
MCH: 37.9 pg — ABNORMAL HIGH (ref 26.0–34.0)
MCHC: 30.8 g/dL (ref 30.0–36.0)
MCV: 123 fL — ABNORMAL HIGH (ref 80.0–100.0)
Platelets: 119 10*3/uL — ABNORMAL LOW (ref 150–400)
RBC: 2.82 MIL/uL — ABNORMAL LOW (ref 3.87–5.11)
RDW: 13.3 % (ref 11.5–15.5)
WBC: 10.3 10*3/uL (ref 4.0–10.5)
nRBC: 1.1 % — ABNORMAL HIGH (ref 0.0–0.2)

## 2019-01-25 LAB — GLUCOSE, CAPILLARY
Glucose-Capillary: 154 mg/dL — ABNORMAL HIGH (ref 70–99)
Glucose-Capillary: 171 mg/dL — ABNORMAL HIGH (ref 70–99)
Glucose-Capillary: 204 mg/dL — ABNORMAL HIGH (ref 70–99)
Glucose-Capillary: 207 mg/dL — ABNORMAL HIGH (ref 70–99)
Glucose-Capillary: 214 mg/dL — ABNORMAL HIGH (ref 70–99)
Glucose-Capillary: 219 mg/dL — ABNORMAL HIGH (ref 70–99)

## 2019-01-25 LAB — PHOSPHORUS
Phosphorus: 2.6 mg/dL (ref 2.5–4.6)
Phosphorus: 2.1 mg/dL — ABNORMAL LOW (ref 2.5–4.6)
Phosphorus: 2.1 mg/dL — ABNORMAL LOW (ref 2.5–4.6)

## 2019-01-25 LAB — MAGNESIUM
Magnesium: 2.2 mg/dL (ref 1.7–2.4)
Magnesium: 2.1 mg/dL (ref 1.7–2.4)
Magnesium: 2 mg/dL (ref 1.7–2.4)

## 2019-01-25 LAB — RPR: RPR Ser Ql: NONREACTIVE

## 2019-01-25 LAB — MRSA PCR SCREENING: MRSA by PCR: NEGATIVE

## 2019-01-25 MED ORDER — HEPARIN SODIUM (PORCINE) 5000 UNIT/ML IJ SOLN
7500.0000 [IU] | Freq: Three times a day (TID) | INTRAMUSCULAR | Status: DC
  Administered 2019-01-25 – 2019-01-26 (×3): 7500 [IU] via SUBCUTANEOUS
  Filled 2019-01-25 (×3): qty 2

## 2019-01-25 MED ORDER — POTASSIUM CHLORIDE 20 MEQ/15ML (10%) PO SOLN
40.0000 meq | Freq: Three times a day (TID) | ORAL | Status: AC
  Administered 2019-01-25 (×2): 40 meq
  Filled 2019-01-25 (×2): qty 30

## 2019-01-25 MED ORDER — CALCIUM GLUCONATE-NACL 1-0.675 GM/50ML-% IV SOLN
1.0000 g | Freq: Once | INTRAVENOUS | Status: AC
  Administered 2019-01-25: 13:00:00 1000 mg via INTRAVENOUS
  Filled 2019-01-25: qty 50

## 2019-01-25 MED ORDER — PRO-STAT SUGAR FREE PO LIQD
30.0000 mL | Freq: Two times a day (BID) | ORAL | Status: DC
  Administered 2019-01-25 – 2019-01-26 (×3): 30 mL
  Filled 2019-01-25 (×6): qty 30

## 2019-01-25 MED ORDER — OSMOLITE 1.2 CAL PO LIQD
1000.0000 mL | ORAL | Status: DC
  Administered 2019-01-25: 15:00:00 1000 mL
  Filled 2019-01-25 (×5): qty 1000

## 2019-01-25 NOTE — Progress Notes (Signed)
VAST received page for CODE BLUE. Arrived to unit and was informed IV services not needed.

## 2019-01-25 NOTE — Progress Notes (Signed)
PHARMACY - PHYSICIAN COMMUNICATION CRITICAL VALUE ALERT - BLOOD CULTURE IDENTIFICATION (BCID)  No results found for this or any previous visit.  Call from lab regarding [place] in 1 out of 4 in anaerobic bottle. Pt on Rocephin  Changes to prescribed antibiotics required: None  Christerpher Clos S. Alford Highland, PharmD, BCPS Clinical Staff Pharmacist [email or web address] [place] 01/25/2019  3:57 AM

## 2019-01-25 NOTE — Progress Notes (Signed)
RT note: attempted SBT on patient this AM however patient had a low respiratory rate and minute ventilation.  Placed patient back on full support ventilator settings.  Tolerating well at this time.  Will continue to monitor.

## 2019-01-25 NOTE — Progress Notes (Signed)
NAME:  [name], MRN:  [id number], DOB:  08-26-1929, LOS: 1 ADMISSION DATE:  01/25/2019, CONSULTATION DATE:  01/25/19 REFERRING MD:  Alcario Drought, CHIEF COMPLAINT:  Dyspnea   Brief History   86 y.o. F with PMH of Atrial Fibrillation, HL, HTN and thyroid disease who was brought in for respiratory distress.  Per report, pt lives at home with her son who was recently hospitalized for a week for Covid-19 leaving no one to care for her.  He has been home a few days, but pt developed respiratory distress and was Covid positive in the ED requiring intubation and found to have R pneumo,  History of present illness   [name] is a 86 y.o. F with PMH Atrial fibrillation, aortic valve replacement, HL, HTN and thyroid disease who has been home alone recently while her son was hospitalized with Covid-19 and then developed respiratory distress several days after his return home and was found to be Covid positive.   Pt's son who lives with her reports that at baseline she is minimally able to perform ADL's, after his return she was too weak to sit up and had not been able to take her medications.  Pt's po intake is mostly Ensure at baseline and probably was not eating or drinking while alone.   In the ED, she was hypoxic in the 80's with increasing work of breathing, so was emergently intubated. CXR revealed small basilar R pneumothorax and labs significant for lactic acid 3.9, sodium of 172,  Creatinine 1.7 with K 3.3.  PCCM consulted for admission  Past Medical History    has a past medical history of A-fib [facility]), Aortic valve prosthesis present, Chronic constipation, History of echocardiogram (2008), echocardiogram (2015), Hyperlipidemia, Hypertension, and Thyroid disease.   Significant Hospital Events   1/11 admit to PCCM  Consults:    Procedures:  1/11 ETT 1/11 R chest tube 1/11 R femoral CVC placed by EDP   Significant Diagnostic Tests:  1/11 CXR>>Small basilar right  pneumothorax.  Micro Data:   1/11 BCx2>> 1/11 UCx2>> 1/11 Respiratory culture>>   Antimicrobials:  Ceftriaxone 1/11  Azithromycin 1/11  Interim history/subjective:  Intermittent agitation overnight No further events  Objective   Blood pressure 98/65, pulse 88, temperature 98.4 F (36.9 C), resp. rate 14, height 4\' 10"  (1.473 m), weight 52.5 kg, SpO2 100 %.    Vent Mode: PRVC FiO2 (%):  [60 %-80 %] 60 % Set Rate:  [16 bmp] 16 bmp Vt Set:  [420 mL] 420 mL PEEP:  [5 cmH20] 5 cmH20 Plateau Pressure:  [14 cmH20-16 cmH20] 16 cmH20   Intake/Output Summary (Last 24 hours) at 01/25/2019 0805 Last data filed at 01/25/2019 0600 Gross per 24 hour  Intake 3229.05 ml  Output 645 ml  Net 2584.05 ml   Filed Weights   01/24/19 0230 01/25/19 0439  Weight: 52.2 kg 52.5 kg   General:  Elderly female, chronically ill appearing, NAD HEENT: [place]/AT, PERRL, EOM-I and MMM, ETT in place Neuro: Sedate, moving all ext to pain CV: RRR, Nl S1/S2 and -M/R/G PULM:  Diminished bilaterally GI: Soft, NT, ND and +BS Extremities: warm/dry, no edema  Skin: skin dry and flaking  I reviewed CXR myself, ETT is in a good position  Discussed with bedside RN and RT  Resolved Hospital Problem list     Assessment & Plan:   Acute hypoxic respiratory failure secondary to R basilar pneumothorax and Covid-19  -Chest tube placed to suction P: - Continue Dexamethasone, Remdesevir,  cover for concurrent bacterial infection with  Ceftriaxone  - Follow inflammatory labs - D/C levophed - Full vent support - HOB elevated 30 degrees. - Plateau pressures less than 30 cm H20.  - Follow chest x-ray, ABG prn.   - Bronchial hygiene and RT/bronchodilator protocol. - Titrate O2 for sat of 88-92%  Hypernatremia and AKI -Received 3L Normal saline in the ED  -likely secondary to significant volume depletion -Creatinine 1.7, lase baseline a year ago <1.0 P: - Continue D5W and free water with frequent Na  monitoring - Monitor for re-feeding syndrome - Monitor UOP and avoid nephrotoxins - Replace electrolytes as indicated  Hypothyroidism - TSH ok and continue synthroid  Failure to thrive  - Prognosis and poor quality of life discussed with Pt's son, code status changed to DNR but continue full supportive care   History of Atrial Fibrillation - Sinus now, will not anti-coagulate   Will continue current care with no escalation until metabolic issues are addressed, if no improvement then will need to discuss comfort measures.  Best practice:  Diet: npo Pain/Anxiety/Delirium protocol (if indicated): fentanyl gtt VAP protocol (if indicated): yes DVT prophylaxis: heparin GI prophylaxis: protonix Glucose control: n/a Mobility: bed rest Code Status: Code discussed with son and changed to DNR but continue full supportive care Family Communication: Son Adelene Idler updated with plan of care Disposition: ICU  Labs   CBC: Recent Labs  Lab 01/24/19 0008 01/24/19 0445 01/25/19 0312 01/25/19 0418  WBC 10.7* 7.8 10.3  --   NEUTROABS 8.6*  --   --   --   HGB 16.3* 8.6* 10.7* 10.5*  HCT 51.9* 28.3* 34.7* 31.0*  MCV 123.9* 126.9* 123.0*  --   PLT 187 94* 119*  --     Basic Metabolic Panel: Recent Labs  Lab 01/24/19 0500 01/24/19 0613 01/24/19 0905 01/24/19 1600 01/24/19 2026 01/25/19 0312 01/25/19 0418  NA 165* 161* 163* 161* 156* 155* 159*  K 3.1* 2.5* 2.9* 4.0 3.9 3.6 3.6  CL >130* >130* >130* >130* 129* 129*  --   CO2 19* 20* 21* 18* 21* 19*  --   GLUCOSE 214* 188* 166* 210* 289* 251*  --   BUN 93* 87* 97* 90* 87* 82*  --   CREATININE 1.39* 1.25* 1.53* 1.35* 1.30* 1.22*  --   CALCIUM 6.3* 6.0* 7.0* 6.5* 6.7* 6.6*  --   MG 2.3  --   --   --   --  2.2  --   PHOS 3.6  --   --   --   --  2.6  --    GFR: Estimated Creatinine Clearance: 23.3 mL/min (A) (by C-G formula based on SCr of 1.22 mg/dL (H)). Recent Labs  Lab 01/24/19 0008 01/24/19 0009 01/24/19 0134  01/24/19 0445 01/25/19 0312  PROCALCITON  --   --  0.15  --   --   WBC 10.7*  --   --  7.8 10.3  LATICACIDVEN  --  3.9* 2.9*  --   --     Liver Function Tests: Recent Labs  Lab 01/24/19 0008 01/24/19 0905  AST 44* 37  ALT 21 17  ALKPHOS 94 73  BILITOT 3.0* 1.7*  PROT 7.3 5.4*  ALBUMIN 2.9* 2.1*   Recent Labs  Lab 01/24/19 0008  LIPASE 33   No results for input(s): AMMONIA in the last 168 hours.  ABG    Component Value Date/Time   PHART 7.364 01/25/2019 0418   PCO2ART 31.1 (L) 01/25/2019  0418   PO2ART 102.0 01/25/2019 0418   HCO3 17.7 (L) 01/25/2019 0418   TCO2 19 (L) 01/25/2019 0418   ACIDBASEDEF 7.0 (H) 01/25/2019 0418   O2SAT 98.0 01/25/2019 0418     Coagulation Profile: Recent Labs  Lab 01/24/19 0437  INR 1.3*    Cardiac Enzymes: Recent Labs  Lab 01/24/19 0437  CKTOTAL 107    HbA1C: Hgb A1c MFr Bld  Date/Time Value Ref Range Status  01/24/2019 04:45 AM 5.5 4.8 - 5.6 % Final    Comment:    (NOTE) Pre diabetes:          5.7%-6.4% Diabetes:              >6.4% Glycemic control for   <7.0% adults with diabetes     CBG: Recent Labs  Lab 01/24/19 1607 01/24/19 1949 01/24/19 2327 01/25/19 0321 01/25/19 0754  GLUCAP 173* 177* 180* 214* 207*   The patient is critically ill with multiple organ systems failure and requires high complexity decision making for assessment and support, frequent evaluation and titration of therapies, application of advanced monitoring technologies and extensive interpretation of multiple databases.   Critical Care Time devoted to patient care services described in this note is  32  Minutes. This time reflects time of care of this signee Dr Jennet Maduro. This critical care time does not reflect procedure time, or teaching time or supervisory time of PA/NP/Med student/Med Resident etc but could involve care discussion time.  Rush Farmer, M.D. [facility] Pulmonary/Critical Care Medicine.

## 2019-01-25 NOTE — Progress Notes (Signed)
 Initial Nutrition Assessment  DOCUMENTATION CODES:   Not applicable  INTERVENTION:   Tube Feeding:  Osmolite 1.2 at 40 ml/hr Pro-Stat BID Provides 1352 kcals, 83 g of protein and 778 mL of free water Meets 100% of protein and calorie needs  Add MVI with Minerals  NUTRITION DIAGNOSIS:   Inadequate oral intake related to acute illness, catabolic illness as evidenced by NPO status.  OAL:   Patient will meet greater than or equal to 90% of their needs  MONITOR:   Vent status, Skin, TF tolerance, Weight trends, Labs  REASON FOR ASSESSMENT:   Consult, Ventilator Enteral/tube feeding initiation and management  ASSESSMENT:   86 yo female admitted with acute respiratory failure secondary to COVID-19 pneumonia and pneumothorax requiring intubation, acute encephalopathy, AKI with hypernatremia, FTT. PMH includes HTN, HLD  1/11 Admit, Intubated  Pt now DNR, very poor prognosis Patient is currently intubated on ventilator support MV: 7 L/min Temp (24hrs), Avg:98.4 F (36.9 C), Min:97.7 F (36.5 C), Max:99.1 F (37.3 C)  Noted reported poor po intake PTA.    Hypernatremic on admission, currently on D5 at 100 ml/hr. Free water ordered 250 mL a 4 hours as well.    Sedated on fentanyl drip, requiring levophed as well  OG with tip coiled in stomach per abd xray  Weight 53.5 kg; based on current wt and previous weight encounters, weight trend is up Unable to obtain additional diet and weight history from pt at this time.   Labs: sodium 159 (H), potassium 3.6 (wdl), ionized calcium 1.02 (L) Meds: decadron, ss novolog, KCl, thiamine  Diet Order:   Diet Order            Diet NPO time specified  Diet effective now              EDUCATION NEEDS:   Not appropriate for education at this time  Skin:  Skin Assessment: Skin Integrity Issues: Skin Integrity Issues:: DTI, Unstageable DTI: L. Heel Unstageable: sacrum  Last BM:  no documented BM  Height:   Ht  Readings from Last 1 Encounters:  01/24/19 4\' 10"  (1.473 m)    Weight:   Wt Readings from Last 1 Encounters:  01/25/19 52.5 kg    BMI:  Body mass index is 24.19 kg/m.  Estimated Nutritional Needs:   Kcal:  1260-1475 kcals  Protein:  79-89 g  Fluid:  >/= 1.5 L     Teagen Bucio MS, RDN, LDN, CNSC [phone] Pager  [phone] Weekend/On-Call Pager

## 2019-01-26 ENCOUNTER — Inpatient Hospital Stay (HOSPITAL_COMMUNITY): Payer: Medicare Other

## 2019-01-26 DIAGNOSIS — Z7189 Other specified counseling: Secondary | ICD-10-CM

## 2019-01-26 LAB — GLUCOSE, CAPILLARY
Glucose-Capillary: 201 mg/dL — ABNORMAL HIGH (ref 70–99)
Glucose-Capillary: 240 mg/dL — ABNORMAL HIGH (ref 70–99)
Glucose-Capillary: 183 mg/dL — ABNORMAL HIGH (ref 70–99)
Glucose-Capillary: 196 mg/dL — ABNORMAL HIGH (ref 70–99)
Glucose-Capillary: 218 mg/dL — ABNORMAL HIGH (ref 70–99)
Glucose-Capillary: 171 mg/dL — ABNORMAL HIGH (ref 70–99)
Glucose-Capillary: 160 mg/dL — ABNORMAL HIGH (ref 70–99)

## 2019-01-26 LAB — MAGNESIUM: Magnesium: 1.9 mg/dL (ref 1.7–2.4)

## 2019-01-26 LAB — BASIC METABOLIC PANEL
Anion gap: 8 (ref 5–15)
BUN: 58 mg/dL — ABNORMAL HIGH (ref 8–23)
CO2: 17 mmol/L — ABNORMAL LOW (ref 22–32)
Calcium: 6.7 mg/dL — ABNORMAL LOW (ref 8.9–10.3)
Chloride: 120 mmol/L — ABNORMAL HIGH (ref 98–111)
Creatinine, Ser: 0.93 mg/dL (ref 0.44–1.00)
GFR calc Af Amer: >60 mL/min (ref >60)
GFR calc non Af Amer: 55 mL/min — ABNORMAL LOW (ref >60)
Glucose, Bld: 230 mg/dL — ABNORMAL HIGH (ref 70–99)
Potassium: 4.4 mmol/L (ref 3.5–5.1)
Sodium: 145 mmol/L (ref 135–145)

## 2019-01-26 LAB — CBC
HCT: 33.1 % — ABNORMAL LOW (ref 36.0–46.0)
Hemoglobin: 10.2 g/dL — ABNORMAL LOW (ref 12.0–15.0)
MCH: 37.4 pg — ABNORMAL HIGH (ref 26.0–34.0)
MCHC: 30.8 g/dL (ref 30.0–36.0)
MCV: 121.2 fL — ABNORMAL HIGH (ref 80.0–100.0)
Platelets: 45 10*3/uL — ABNORMAL LOW (ref 150–400)
RBC: 2.73 MIL/uL — ABNORMAL LOW (ref 3.87–5.11)
RDW: 12.6 % (ref 11.5–15.5)
WBC: 6.2 10*3/uL (ref 4.0–10.5)
nRBC: 1.1 % — ABNORMAL HIGH (ref 0.0–0.2)

## 2019-01-26 LAB — PHOSPHORUS: Phosphorus: 1.8 mg/dL — ABNORMAL LOW (ref 2.5–4.6)

## 2019-01-26 MED ORDER — INSULIN DETEMIR 100 UNIT/ML ~~LOC~~ SOLN
10.0000 [IU] | Freq: Two times a day (BID) | SUBCUTANEOUS | Status: DC
  Administered 2019-01-26 – 2019-01-28 (×3): 10 [IU] via SUBCUTANEOUS
  Filled 2019-01-26 (×6): qty 0.1

## 2019-01-26 MED ORDER — CALCIUM GLUCONATE-NACL 1-0.675 GM/50ML-% IV SOLN
1.0000 g | Freq: Once | INTRAVENOUS | Status: AC
  Administered 2019-01-26: 12:00:00 1000 mg via INTRAVENOUS
  Filled 2019-01-26: qty 50

## 2019-01-26 MED ORDER — SODIUM CHLORIDE 0.9% FLUSH: 3.0000 mL | INTRAVENOUS | Status: DC | PRN

## 2019-01-26 MED ORDER — PANTOPRAZOLE SODIUM 40 MG IV SOLR
40.0000 mg | Freq: Two times a day (BID) | INTRAVENOUS | Status: DC
  Administered 2019-01-26 – 2019-01-28 (×4): 40 mg via INTRAVENOUS
  Filled 2019-01-26 (×4): qty 40

## 2019-01-26 MED ORDER — SODIUM CHLORIDE 0.9% FLUSH
3.0000 mL | Freq: Two times a day (BID) | INTRAVENOUS | Status: DC
  Administered 2019-01-26: 11:00:00 3 mL via INTRAVENOUS

## 2019-01-26 MED ORDER — SODIUM CHLORIDE 0.9 % IV SOLN: 250.0000 mL | INTRAVENOUS | Status: DC | PRN

## 2019-01-26 MED ORDER — SODIUM PHOSPHATES 45 MMOLE/15ML IV SOLN
20.0000 mmol | Freq: Once | INTRAVENOUS | Status: AC
  Administered 2019-01-26: 12:00:00 20 mmol via INTRAVENOUS
  Filled 2019-01-26: qty 6.67

## 2019-01-26 MED ORDER — FUROSEMIDE 10 MG/ML IJ SOLN
20.0000 mg | Freq: Four times a day (QID) | INTRAMUSCULAR | Status: AC
  Administered 2019-01-26 (×3): 20 mg via INTRAVENOUS
  Filled 2019-01-26 (×3): qty 2

## 2019-01-26 MED ORDER — SODIUM CHLORIDE 0.9 % IV SOLN
INTRAVENOUS | Status: DC | PRN
  Administered 2019-01-26: 08:00:00 500 mL via INTRAVENOUS

## 2019-01-26 MED ORDER — METOCLOPRAMIDE HCL 5 MG/ML IJ SOLN
10.0000 mg | Freq: Once | INTRAMUSCULAR | Status: AC
  Administered 2019-01-26: 18:00:00 10 mg via INTRAVENOUS
  Filled 2019-01-26: qty 2

## 2019-01-26 NOTE — Progress Notes (Signed)
PHARMACY - PHYSICIAN COMMUNICATION CRITICAL VALUE ALERT - BLOOD CULTURE IDENTIFICATION (BCID)  [name] is an 86 y.o. female who presented to [facility] on 02/10/2019 with a chief complaint of Covid-19 with decreased responsiveness.  Assessment:  Blood cx now growing GPR in aerobic bottle of one set (other set grew GPC in anaerobic bottle yesterday), likely contaminant.  Current antibiotics: Rocephin  Changes to prescribed antibiotics recommended:  No changes for now.   Wynona Neat, PharmD, BCPS  01/26/2019  5:37 AM

## 2019-01-26 NOTE — Progress Notes (Signed)
NAME:  [name], MRN:  [id number], DOB:  01/24/1929, LOS: 2 ADMISSION DATE:  02/08/2019, CONSULTATION DATE:  01/26/19 REFERRING MD:  Alcario Drought, CHIEF COMPLAINT:  Dyspnea   Brief History   86 y.o. F with PMH of Atrial Fibrillation, HL, HTN and thyroid disease who was brought in for respiratory distress.  Per report, pt lives at home with her son who was recently hospitalized for a week for Covid-19 leaving no one to care for her.  He has been home a few days, but pt developed respiratory distress and was Covid positive in the ED requiring intubation and found to have R pneumo,  History of present illness   [name] is a 86 y.o. F with PMH Atrial fibrillation, aortic valve replacement, HL, HTN and thyroid disease who has been home alone recently while her son was hospitalized with Covid-19 and then developed respiratory distress several days after his return home and was found to be Covid positive.   Pt's son who lives with her reports that at baseline she is minimally able to perform ADL's, after his return she was too weak to sit up and had not been able to take her medications.  Pt's po intake is mostly Ensure at baseline and probably was not eating or drinking while alone.   In the ED, she was hypoxic in the 80's with increasing work of breathing, so was emergently intubated. CXR revealed small basilar R pneumothorax and labs significant for lactic acid 3.9, sodium of 172,  Creatinine 1.7 with K 3.3.  PCCM consulted for admission  Past Medical History    has a past medical history of A-fib [facility]), Aortic valve prosthesis present, Chronic constipation, History of echocardiogram (2008), echocardiogram (2015), Hyperlipidemia, Hypertension, and Thyroid disease.  Significant Hospital Events   1/11 admit to PCCM  Consults:    Procedures:  1/11 ETT 1/11 R chest tube 1/11 R femoral CVC placed by EDP   Significant Diagnostic Tests:  1/11 CXR>>Small basilar right  pneumothorax.  Micro Data:   1/11 BCx2>> 1/11 UCx2>> 1/11 Respiratory culture>>   Antimicrobials:  Ceftriaxone 1/11  Azithromycin 1/11  Interim history/subjective:  Intermittent agitation Did not tolerate weaning this AM Hypotensive overnight, required levophed  Objective   Blood pressure (!) 105/53, pulse 82, temperature 99.9 F (37.7 C), resp. rate (!) 22, height 4\' 10"  (1.473 m), weight 53.3 kg, SpO2 100 %.    Vent Mode: PSV;CPAP FiO2 (%):  [30 %-50 %] 30 % Set Rate:  [16 bmp] 16 bmp Vt Set:  [420 mL] 420 mL PEEP:  [5 cmH20] 5 cmH20 Pressure Support:  [5 cmH20] 5 cmH20 Plateau Pressure:  [13 cmH20-15 cmH20] 13 cmH20   Intake/Output Summary (Last 24 hours) at 01/26/2019 0809 Last data filed at 01/26/2019 0700 Gross per 24 hour  Intake 4918.34 ml  Output 900 ml  Net 4018.34 ml   Filed Weights   01/24/19 0230 01/25/19 0439 01/26/19 0500  Weight: 52.2 kg 52.5 kg 53.3 kg   General:  Chronically ill appearing elderly female, grimaces to pain but no other interaction HEENT: [place]/AT, PERRL, EOM-I and MMM, ETT is in place Neuro: Sedate, grimaces and moves all ext to pain CV: RRR, Nl S1/S2 and -M/R/G PULM:  Diminished diffusely GI: Soft, NT, ND and +BS Extremities: warm/dry, no edema  Skin: Skin is dry and flaky   I reviewed CXR myself, ETT is in a good position  Discussed with bedside RN and RT  Resolved Hospital Problem list  Assessment & Plan:   Acute hypoxic respiratory failure secondary to R basilar pneumothorax and Covid-19  -Chest tube placed to suction P: - Continue Dexamethasone 10, Remdesevir 5, cover for concurrent bacterial infection with  Ceftriaxone 7, stop dates in place - Follow inflammatory markers - D/C levophed - Begin PS trials but no extubation given mental status - HOB elevated 30 degrees. - Plateau pressures less than 30 cm H20.  - CXR and ABG in AM - Bronchial hygiene and RT/bronchodilator protocol. - Titrate O2 for sat of  88-92%  Hypernatremia and AKI -Received 3L Normal saline in the ED  -likely secondary to significant volume depletion -Creatinine 1.7, lase baseline a year ago <1.0 P: - D/Continue D5W - Free water with frequent Na monitoring - Monitor for re-feeding syndrome - Monitor I/O - Low dose lasix to address hyperchloremic metabolic acidosis now that Na is normalized but continue free water - Replace electrolytes as indicated  Hypothyroidism - TSH ok and continue synthroid  Failure to thrive  - Prognosis and poor quality of life discussed with Pt's son, code status changed to DNR but continue full supportive care   Thrombocytopenia: - HITT panel - SCDs - D/C heparin  History of Atrial Fibrillation - Sinus now, will not anti-coagulate   Spoke with son over the phone today, no further escalation of care, he is discussing the option of trach/peg which I strongly recommended against given the patient's overall health and quality of life.  He will talk to his sister and I will call him again tomorrow and discuss further but he does not feel that this would be an existence his mother would want.  But still would like to speak to his sister which is reasonable.  Best practice:  Diet: npo Pain/Anxiety/Delirium protocol (if indicated): fentanyl gtt VAP protocol (if indicated): yes DVT prophylaxis: heparin GI prophylaxis: protonix Glucose control: n/a Mobility: bed rest Code Status: Code discussed with son and changed to DNR but continue full supportive care Family Communication: Son Adelene Idler updated with plan of care Disposition: ICU  Labs   CBC: Recent Labs  Lab 01/24/19 0008 01/24/19 0445 01/25/19 0312 01/25/19 0418 01/26/19 0500  WBC 10.7* 7.8 10.3  --  6.2  NEUTROABS 8.6*  --   --   --   --   HGB 16.3* 8.6* 10.7* 10.5* 10.2*  HCT 51.9* 28.3* 34.7* 31.0* 33.1*  MCV 123.9* 126.9* 123.0*  --  121.2*  PLT 187 94* 119*  --  45*    Basic Metabolic Panel: Recent Labs  Lab  01/24/19 0500 01/24/19 0905 01/24/19 1600 01/24/19 2026 01/25/19 0312 01/25/19 0418 01/25/19 1327 01/25/19 1730 01/26/19 0500  NA 165* 163* 161* 156* 155* 159*  --   --  145  K 3.1* 2.9* 4.0 3.9 3.6 3.6  --   --  4.4  CL >130* >130* >130* 129* 129*  --   --   --  120*  CO2 19* 21* 18* 21* 19*  --   --   --  17*  GLUCOSE 214* 166* 210* 289* 251*  --   --   --  230*  BUN 93* 97* 90* 87* 82*  --   --   --  58*  CREATININE 1.39* 1.53* 1.35* 1.30* 1.22*  --   --   --  0.93  CALCIUM 6.3* 7.0* 6.5* 6.7* 6.6*  --   --   --  6.7*  MG 2.3  --   --   --  2.2  --  2.1 2.0 1.9  PHOS 3.6  --   --   --  2.6  --  2.1* 2.1* 1.8*   GFR: Estimated Creatinine Clearance: 30.9 mL/min (by C-G formula based on SCr of 0.93 mg/dL). Recent Labs  Lab 01/24/19 0008 01/24/19 0009 01/24/19 0134 01/24/19 0445 01/25/19 0312 01/26/19 0500  PROCALCITON  --   --  0.15  --   --   --   WBC 10.7*  --   --  7.8 10.3 6.2  LATICACIDVEN  --  3.9* 2.9*  --   --   --     Liver Function Tests: Recent Labs  Lab 01/24/19 0008 01/24/19 0905  AST 44* 37  ALT 21 17  ALKPHOS 94 73  BILITOT 3.0* 1.7*  PROT 7.3 5.4*  ALBUMIN 2.9* 2.1*   Recent Labs  Lab 01/24/19 0008  LIPASE 33   No results for input(s): AMMONIA in the last 168 hours.  ABG    Component Value Date/Time   PHART 7.364 01/25/2019 0418   PCO2ART 31.1 (L) 01/25/2019 0418   PO2ART 102.0 01/25/2019 0418   HCO3 17.7 (L) 01/25/2019 0418   TCO2 19 (L) 01/25/2019 0418   ACIDBASEDEF 7.0 (H) 01/25/2019 0418   O2SAT 98.0 01/25/2019 0418     Coagulation Profile: Recent Labs  Lab 01/24/19 0437  INR 1.3*    Cardiac Enzymes: Recent Labs  Lab 01/24/19 0437  CKTOTAL 107    HbA1C: Hgb A1c MFr Bld  Date/Time Value Ref Range Status  01/24/2019 04:45 AM 5.5 4.8 - 5.6 % Final    Comment:    (NOTE) Pre diabetes:          5.7%-6.4% Diabetes:              >6.4% Glycemic control for   <7.0% adults with diabetes     CBG: Recent Labs  Lab  01/25/19 1543 01/25/19 1932 01/25/19 2333 01/26/19 0343 01/26/19 0743  GLUCAP 171* 204* 219* 201* 240*   The patient is critically ill with multiple organ systems failure and requires high complexity decision making for assessment and support, frequent evaluation and titration of therapies, application of advanced monitoring technologies and extensive interpretation of multiple databases.   Critical Care Time devoted to patient care services described in this note is  31  Minutes. This time reflects time of care of this signee Dr Jennet Maduro. This critical care time does not reflect procedure time, or teaching time or supervisory time of PA/NP/Med student/Med Resident etc but could involve care discussion time.  Rush Farmer, M.D. [facility] Pulmonary/Critical Care Medicine.

## 2019-01-26 NOTE — Progress Notes (Signed)
This RN noticed an episode of vomiting happening with brown drainage coming from the mouth. Patient tube feeds on hold at this time. Patient was on PS/CPAP so RRT was notified and patient placed back on PRVC. Patient is tachycardic in the 120s, tachypneic in the 30s with increased work of breathing. Dr. Nelda Marseille notified of event. Tube feeds to remain on hold and will reassess in a few hours pending restart.

## 2019-01-27 ENCOUNTER — Inpatient Hospital Stay (HOSPITAL_COMMUNITY): Payer: Medicare Other

## 2019-01-27 LAB — BASIC METABOLIC PANEL
Anion gap: 10 (ref 5–15)
Anion gap: 11 (ref 5–15)
BUN: 53 mg/dL — ABNORMAL HIGH (ref 8–23)
BUN: 45 mg/dL — ABNORMAL HIGH (ref 8–23)
CO2: 20 mmol/L — ABNORMAL LOW (ref 22–32)
CO2: 20 mmol/L — ABNORMAL LOW (ref 22–32)
Calcium: 6.8 mg/dL — ABNORMAL LOW (ref 8.9–10.3)
Calcium: 7.1 mg/dL — ABNORMAL LOW (ref 8.9–10.3)
Chloride: 110 mmol/L (ref 98–111)
Chloride: 113 mmol/L — ABNORMAL HIGH (ref 98–111)
Creatinine, Ser: 1.05 mg/dL — ABNORMAL HIGH (ref 0.44–1.00)
Creatinine, Ser: 0.9 mg/dL (ref 0.44–1.00)
GFR calc Af Amer: 55 mL/min — ABNORMAL LOW (ref >60)
GFR calc Af Amer: >60 mL/min (ref >60)
GFR calc non Af Amer: 48 mL/min — ABNORMAL LOW (ref >60)
GFR calc non Af Amer: 57 mL/min — ABNORMAL LOW (ref >60)
Glucose, Bld: 223 mg/dL — ABNORMAL HIGH (ref 70–99)
Glucose, Bld: 90 mg/dL (ref 70–99)
Potassium: 2.9 mmol/L — ABNORMAL LOW (ref 3.5–5.1)
Potassium: 3.7 mmol/L (ref 3.5–5.1)
Sodium: 140 mmol/L (ref 135–145)
Sodium: 144 mmol/L (ref 135–145)

## 2019-01-27 LAB — CBC
HCT: 30.6 % — ABNORMAL LOW (ref 36.0–46.0)
Hemoglobin: 10.4 g/dL — ABNORMAL LOW (ref 12.0–15.0)
MCH: 38.8 pg — ABNORMAL HIGH (ref 26.0–34.0)
MCHC: 34 g/dL (ref 30.0–36.0)
MCV: 114.2 fL — ABNORMAL HIGH (ref 80.0–100.0)
Platelets: 31 10*3/uL — ABNORMAL LOW (ref 150–400)
RBC: 2.68 MIL/uL — ABNORMAL LOW (ref 3.87–5.11)
RDW: 12.5 % (ref 11.5–15.5)
WBC: 6.7 10*3/uL (ref 4.0–10.5)
nRBC: 0.5 % — ABNORMAL HIGH (ref 0.0–0.2)

## 2019-01-27 LAB — GLUCOSE, CAPILLARY
Glucose-Capillary: 134 mg/dL — ABNORMAL HIGH (ref 70–99)
Glucose-Capillary: 145 mg/dL — ABNORMAL HIGH (ref 70–99)
Glucose-Capillary: 60 mg/dL — ABNORMAL LOW (ref 70–99)
Glucose-Capillary: 64 mg/dL — ABNORMAL LOW (ref 70–99)
Glucose-Capillary: 90 mg/dL (ref 70–99)
Glucose-Capillary: 91 mg/dL (ref 70–99)
Glucose-Capillary: 96 mg/dL (ref 70–99)
Glucose-Capillary: 96 mg/dL (ref 70–99)

## 2019-01-27 LAB — CULTURE, BLOOD (ROUTINE X 2): Special Requests: ADEQUATE

## 2019-01-27 LAB — MAGNESIUM: Magnesium: 1.7 mg/dL (ref 1.7–2.4)

## 2019-01-27 LAB — HEPARIN INDUCED PLATELET AB (HIT ANTIBODY): Heparin Induced Plt Ab: 0.1 OD (ref 0.000–0.400)

## 2019-01-27 LAB — PHOSPHORUS: Phosphorus: 3.4 mg/dL (ref 2.5–4.6)

## 2019-01-27 MED ORDER — LACTATED RINGERS IV SOLN: INTRAVENOUS | Status: DC

## 2019-01-27 MED ORDER — MAGNESIUM SULFATE 2 GM/50ML IV SOLN
2.0000 g | Freq: Once | INTRAVENOUS | Status: AC
  Administered 2019-01-27: 11:00:00 2 g via INTRAVENOUS
  Filled 2019-01-27: qty 50

## 2019-01-27 MED ORDER — FREE WATER: 250.0000 mL | Freq: Four times a day (QID) | Status: DC

## 2019-01-27 MED ORDER — DEXMEDETOMIDINE HCL IN NACL 400 MCG/100ML IV SOLN
0.4000 ug/kg/h | INTRAVENOUS | Status: DC
  Administered 2019-01-27 – 2019-01-28 (×2): 0.4 ug/kg/h via INTRAVENOUS
  Filled 2019-01-27 (×2): qty 100

## 2019-01-27 MED ORDER — POTASSIUM CHLORIDE 10 MEQ/50ML IV SOLN
10.0000 meq | INTRAVENOUS | Status: AC
  Administered 2019-01-27 (×4): 10 meq via INTRAVENOUS
  Filled 2019-01-27 (×4): qty 50

## 2019-01-27 MED ORDER — ALBUMIN HUMAN 5 % IV SOLN
12.5000 g | Freq: Once | INTRAVENOUS | Status: AC
  Administered 2019-01-27: 22:00:00 12.5 g via INTRAVENOUS
  Filled 2019-01-27: qty 250

## 2019-01-27 MED ORDER — VASOPRESSIN 20 UNIT/ML IV SOLN
0.0300 [IU]/min | INTRAVENOUS | Status: DC
  Filled 2019-01-27: qty 2

## 2019-01-27 MED ORDER — DEXTROSE 50 % IV SOLN
INTRAVENOUS | Status: AC
  Administered 2019-01-27: 11:00:00 50 mL
  Filled 2019-01-27: qty 50

## 2019-01-27 NOTE — Progress Notes (Signed)
NAME:  [name], MRN:  [id number], DOB:  1929/01/20, LOS: 3 ADMISSION DATE:  01/21/2019, CONSULTATION DATE:  01/27/19 REFERRING MD:  Alcario Drought, CHIEF COMPLAINT:  Dyspnea   Brief History   86 y.o. F with PMH of Atrial Fibrillation, HL, HTN and thyroid disease who was brought in for respiratory distress.  Per report, pt lives at home with her son who was recently hospitalized for a week for Covid-19 leaving no one to care for her.  He has been home a few days, but pt developed respiratory distress and was Covid positive in the ED requiring intubation and found to have R pneumo,  History of present illness   [name] is a 86 y.o. F with PMH Atrial fibrillation, aortic valve replacement, HL, HTN and thyroid disease who has been home alone recently while her son was hospitalized with Covid-19 and then developed respiratory distress several days after his return home and was found to be Covid positive.   Pt's son who lives with her reports that at baseline she is minimally able to perform ADL's, after his return she was too weak to sit up and had not been able to take her medications.  Pt's po intake is mostly Ensure at baseline and probably was not eating or drinking while alone.   In the ED, she was hypoxic in the 80's with increasing work of breathing, so was emergently intubated. CXR revealed small basilar R pneumothorax and labs significant for lactic acid 3.9, sodium of 172,  Creatinine 1.7 with K 3.3.  PCCM consulted for admission  Past Medical History    has a past medical history of A-fib [facility]), Aortic valve prosthesis present, Chronic constipation, History of echocardiogram (2008), echocardiogram (2015), Hyperlipidemia, Hypertension, and Thyroid disease.  Significant Hospital Events   1/11 admit to PCCM  Consults:    Procedures:  1/11 ETT 1/11 R chest tube 1/11 R femoral CVC placed by EDP  Significant Diagnostic Tests:  1/11 CXR>>Small basilar right pneumothorax.  Micro  Data:  1/11 BCx2>> 1/11 UCx2>> 1/11 Respiratory culture>>   Antimicrobials:  Ceftriaxone 1/11>>> Azithromycin 1/11>>>  Interim history/subjective:  Intermittent agitation overnight No further events  Objective   Blood pressure (!) 83/38, pulse 94, temperature (!) 96.3 F (35.7 C), temperature source Bladder, resp. rate 15, height 4\' 10"  (1.473 m), weight 55.3 kg, SpO2 97 %.    Vent Mode: PRVC FiO2 (%):  [40 %-100 %] 60 % Set Rate:  [16 bmp] 16 bmp Vt Set:  [420 mL] 420 mL PEEP:  [5 cmH20] 5 cmH20   Intake/Output Summary (Last 24 hours) at 01/27/2019 0844 Last data filed at 01/27/2019 0800 Gross per 24 hour  Intake 2879.03 ml  Output 3750 ml  Net -870.97 ml   Filed Weights   01/25/19 0439 01/26/19 0500 01/27/19 0351  Weight: 52.5 kg 53.3 kg 55.3 kg   General:  Chronically ill appearing elderly female, NAD, agitated with  HEENT: [place]/AT, PERRL, EOM-spontaneous and DMM, ETT in place Neuro: Grimace to pain in all ext and attempts to withdraw CV: RRR, Nl S1/S2 and -M/R/G PULM:  Decreased BS diffusely GI: Soft, NT, ND and +BS Extremities: -edema and -tenderness Skin: Skin is dry and flaky   I reviewed CXR myself, ETT is in a good position  Discussed with [facility] Problem list     Assessment & Plan:   Acute hypoxic respiratory failure secondary to R basilar pneumothorax and Covid-19  -Chest tube placed to suction P: -  Continue Dexamethasone 10, Remdesevir 5, cover for concurrent bacterial infection with  Ceftriaxone 7, stop dates in place - Levophed for MAP of 65 - Titrate O2 for sat of 88-92% - Active diureses given today in an attempt for a one way extubation in AM since O2 demand is actually improving - HOB elevated 30 degrees. - Plateau pressures less than 30 cm H20.  - CXR and ABG in AM - Bronchial hygiene and RT/bronchodilator protocol.  Hypernatremia and AKI -Received 3L Normal saline in the ED  -likely secondary to significant volume  depletion -Creatinine 1.7, lase baseline a year ago <1.0 P: - D/C D5W - Decrease free water to 250 q6 - Monitor for re-feeding syndrome - Monitor I/O - Lasix 40 q6 x3 - Zaroxolyn 10 x1 - K3PO4 30 - Mg 2 - Replace electrolytes as indicated  Hypothyroidism - TSH ok and continue synthroid  Failure to thrive  - Prognosis and poor quality of life discussed with Pt's son, code status changed to DNR but continue full supportive care   Thrombocytopenia: - HITT panel - SCDs - D/C heparin  History of Atrial Fibrillation - Sinus now, will not anti-coagulate   Since O2 demand is improving and renal function is holding, will actively diurese today and the plan will be for one way extubation in AM after speaking with the son, not a trach candidate.  Plan for one way extubation at 10 AM on 1/15 and will need to determine if will need morphine or not in AM prior to extubation  Best practice:  Diet: npo Pain/Anxiety/Delirium protocol (if indicated): fentanyl gtt VAP protocol (if indicated): yes DVT prophylaxis: heparin GI prophylaxis: protonix Glucose control: n/a Mobility: bed rest Code Status: Code discussed with son and changed to DNR but continue full supportive care Family Communication: Son Adelene Idler updated with plan of care Disposition: ICU  Labs   CBC: Recent Labs  Lab 01/24/19 0008 01/24/19 0008 01/24/19 0445 01/25/19 0312 01/25/19 0418 01/26/19 0500 01/27/19 0343  WBC 10.7*  --  7.8 10.3  --  6.2 6.7  NEUTROABS 8.6*  --   --   --   --   --   --   HGB 16.3*   < > 8.6* 10.7* 10.5* 10.2* 10.4*  HCT 51.9*   < > 28.3* 34.7* 31.0* 33.1* 30.6*  MCV 123.9*  --  126.9* 123.0*  --  121.2* 114.2*  PLT 187  --  94* 119*  --  45* 31*   < > = values in this interval not displayed.    Basic Metabolic Panel: Recent Labs  Lab 01/24/19 0500 01/24/19 1600 01/24/19 1600 01/24/19 2026 01/25/19 0312 01/25/19 0418 01/25/19 1327 01/25/19 1730 01/26/19 0500 01/27/19 0343    NA  --  161*   < > 156* 155* 159*  --   --  145 140  K  --  4.0   < > 3.9 3.6 3.6  --   --  4.4 2.9*  CL  --  >130*  --  129* 129*  --   --   --  120* 110  CO2  --  18*  --  21* 19*  --   --   --  17* 20*  GLUCOSE  --  210*  --  289* 251*  --   --   --  230* 223*  BUN  --  90*  --  87* 82*  --   --   --  58* 53*  CREATININE  --  1.35*  --  1.30* 1.22*  --   --   --  0.93 1.05*  CALCIUM  --  6.5*  --  6.7* 6.6*  --   --   --  6.7* 6.8*  MG   < >  --   --   --  2.2  --  2.1 2.0 1.9 1.7  PHOS   < >  --   --   --  2.6  --  2.1* 2.1* 1.8* 3.4   < > = values in this interval not displayed.   GFR: Estimated Creatinine Clearance: 27.8 mL/min (A) (by C-G formula based on SCr of 1.05 mg/dL (H)). Recent Labs  Lab 01/24/19 0008 01/24/19 0009 01/24/19 0134 01/24/19 0445 01/25/19 0312 01/26/19 0500 01/27/19 0343  PROCALCITON  --   --  0.15  --   --   --   --   WBC   < >  --   --  7.8 10.3 6.2 6.7  LATICACIDVEN  --  3.9* 2.9*  --   --   --   --    < > = values in this interval not displayed.    Liver Function Tests: Recent Labs  Lab 01/24/19 0008 01/24/19 0905  AST 44* 37  ALT 21 17  ALKPHOS 94 73  BILITOT 3.0* 1.7*  PROT 7.3 5.4*  ALBUMIN 2.9* 2.1*   Recent Labs  Lab 01/24/19 0008  LIPASE 33   No results for input(s): AMMONIA in the last 168 hours.  ABG    Component Value Date/Time   PHART 7.364 01/25/2019 0418   PCO2ART 31.1 (L) 01/25/2019 0418   PO2ART 102.0 01/25/2019 0418   HCO3 17.7 (L) 01/25/2019 0418   TCO2 19 (L) 01/25/2019 0418   ACIDBASEDEF 7.0 (H) 01/25/2019 0418   O2SAT 98.0 01/25/2019 0418     Coagulation Profile: Recent Labs  Lab 01/24/19 0437  INR 1.3*    Cardiac Enzymes: Recent Labs  Lab 01/24/19 0437  CKTOTAL 107    HbA1C: Hgb A1c MFr Bld  Date/Time Value Ref Range Status  01/24/2019 04:45 AM 5.5 4.8 - 5.6 % Final    Comment:    (NOTE) Pre diabetes:          5.7%-6.4% Diabetes:              >6.4% Glycemic control for    <7.0% adults with diabetes     CBG: Recent Labs  Lab 01/26/19 1526 01/26/19 1935 01/26/19 2328 01/27/19 0320 01/27/19 0732  GLUCAP 218* 171* 160* 145* 90   The patient is critically ill with multiple organ systems failure and requires high complexity decision making for assessment and support, frequent evaluation and titration of therapies, application of advanced monitoring technologies and extensive interpretation of multiple databases.   Critical Care Time devoted to patient care services described in this note is  33  Minutes. This time reflects time of care of this signee Dr Jennet Maduro. This critical care time does not reflect procedure time, or teaching time or supervisory time of PA/NP/Med student/Med Resident etc but could involve care discussion time.  Rush Farmer, M.D. [facility] Pulmonary/Critical Care Medicine.

## 2019-01-27 NOTE — Progress Notes (Addendum)
Per CCM note and RN report, plan for one way extubation tomorrow (1/15) at 10am. Per report, given the OK to not treat hypotension at this time due to plan for tomorrow. Will continue to monitor and keep patient comfortable.  No mention of not escalating care or comfort measures in CCM note. Called Dr. Lucile Shutters, [facility] Lacy, and asked to clarify plan of action for tonight. Will continue to monitor.

## 2019-01-27 NOTE — Progress Notes (Signed)
eLink Physician-Brief Progress Note Patient Name: [name] DOB: October 21, 1929 MRN: [id number]   Date of Service  01/27/2019  HPI/Events of Note  Hypotension, Pt reportedly for one way extubation in a.m., however progress note states continue aggressive care despite DNR status.  eICU Interventions  Albumin 5 % 250 ml iv x 1, Begin Norepinephrine infusion, minimize Precedex infusion, Family to be called in for terminal visitation.        Kerry Kass Annelie Boak 01/27/2019, 9:58 PM

## 2019-01-27 NOTE — Progress Notes (Signed)
eLink Physician-Brief Progress Note Patient Name: [name] DOB: Mar 29, 1929 MRN: [id number]   Date of Service  01/27/2019  HPI/Events of Note  K 2.9, improving creatinine and normalized sodium, along with glucose > 200  eICU Interventions  Start with 40 meq IV Kcl  Stop D5 Switch from D5 to LR at same rate BMP at 1 pm ordered     Intervention Category Major Interventions: Electrolyte abnormality - evaluation and management  Margaretmary Lombard 01/27/2019, 5:54 AM

## 2019-01-28 ENCOUNTER — Inpatient Hospital Stay (HOSPITAL_COMMUNITY): Payer: Medicare Other

## 2019-01-28 DIAGNOSIS — Z515 Encounter for palliative care: Secondary | ICD-10-CM

## 2019-01-28 DIAGNOSIS — A419 Sepsis, unspecified organism: Secondary | ICD-10-CM

## 2019-01-28 DIAGNOSIS — R6521 Severe sepsis with septic shock: Secondary | ICD-10-CM

## 2019-01-28 LAB — GLUCOSE, CAPILLARY
Glucose-Capillary: 119 mg/dL — ABNORMAL HIGH (ref 70–99)
Glucose-Capillary: 109 mg/dL — ABNORMAL HIGH (ref 70–99)

## 2019-01-28 MED ORDER — GLYCOPYRROLATE 0.2 MG/ML IJ SOLN: 0.2000 mg | INTRAMUSCULAR | Status: DC | PRN

## 2019-01-28 MED ORDER — ACETAMINOPHEN 650 MG RE SUPP: 650.0000 mg | Freq: Four times a day (QID) | RECTAL | Status: DC | PRN

## 2019-01-28 MED ORDER — MORPHINE SULFATE (PF) 2 MG/ML IV SOLN: 2.0000 mg | INTRAVENOUS | Status: DC | PRN

## 2019-01-28 MED ORDER — MORPHINE BOLUS VIA INFUSION
5.0000 mg | INTRAVENOUS | Status: DC | PRN
  Administered 2019-01-28 – 2019-01-31 (×6): 5 mg via INTRAVENOUS
  Filled 2019-01-28: qty 5

## 2019-01-28 MED ORDER — MORPHINE 100MG IN NS 100ML (1MG/ML) PREMIX INFUSION
0.0000 mg/h | INTRAVENOUS | Status: DC
  Administered 2019-01-28: 16:00:00 20 mg/h via INTRAVENOUS
  Administered 2019-01-28: 12:00:00 5 mg/h via INTRAVENOUS
  Administered 2019-01-28: 20:00:00 18 mg/h via INTRAVENOUS
  Administered 2019-01-29: 09:00:00 14 mg/h via INTRAVENOUS
  Administered 2019-01-29: 20 mg/h via INTRAVENOUS
  Administered 2019-01-29: 15 mg/h via INTRAVENOUS
  Administered 2019-01-29: 20 mg/h via INTRAVENOUS
  Administered 2019-01-30: 12:00:00 24 mg/h via INTRAVENOUS
  Administered 2019-01-30: 07:00:00 20 mg/h via INTRAVENOUS
  Administered 2019-01-30 (×2): 24 mg/h via INTRAVENOUS
  Administered 2019-01-30: 01:00:00 20 mg/h via INTRAVENOUS
  Administered 2019-01-31 (×3): 24 mg/h via INTRAVENOUS
  Filled 2019-01-28 (×16): qty 100

## 2019-01-28 MED ORDER — GLYCOPYRROLATE 1 MG PO TABS: 1.0000 mg | ORAL_TABLET | ORAL | Status: DC | PRN

## 2019-01-28 MED ORDER — ACETAMINOPHEN 325 MG PO TABS: 650.0000 mg | ORAL_TABLET | Freq: Four times a day (QID) | ORAL | Status: DC | PRN

## 2019-01-28 MED ORDER — DEXTROSE 5 % IV SOLN: INTRAVENOUS | Status: DC

## 2019-01-28 MED ORDER — DIPHENHYDRAMINE HCL 50 MG/ML IJ SOLN: 25.0000 mg | INTRAMUSCULAR | Status: DC | PRN

## 2019-01-28 MED ORDER — POLYVINYL ALCOHOL 1.4 % OP SOLN
1.0000 [drp] | Freq: Four times a day (QID) | OPHTHALMIC | Status: DC | PRN
  Administered 2019-01-28 – 2019-01-29 (×3): 1 [drp] via OPHTHALMIC
  Filled 2019-01-28: qty 15

## 2019-01-28 NOTE — Procedures (Signed)
Extubation Procedure Note  Patient Details:   Name: [name] DOB: 10-25-1929 MRN: [id number]   Airway Documentation:    Vent end date: 01/28/19 Vent end time: 1208   Evaluation  O2 sats: patient extubated to comfort care Complications: Complications of extubated to comfort care Patient did tolerate procedure well. Bilateral Breath Sounds: Diminished   No   Patient extubated to comfort care per order and family wishes. Positive cuff leak was noted prior to extubation. RN is at patient bedside. RT will continue to monitor.   Keena Dinse Clyda Greener 01/28/2019, 12:12 PM

## 2019-01-28 NOTE — Progress Notes (Signed)
NAME:  [name], MRN:  [id number], DOB:  June 18, 1929, LOS: 4 ADMISSION DATE:  02/08/2019, CONSULTATION DATE:  01/28/19 REFERRING MD:  Alcario Drought, CHIEF COMPLAINT:  Dyspnea   Brief History   86 y.o. F with PMH of Atrial Fibrillation, HL, HTN and thyroid disease who was brought in for respiratory distress.  Per report, pt lives at home with her son who was recently hospitalized for a week for Covid-19 leaving no one to care for her.  He has been home a few days, but pt developed respiratory distress and was Covid positive in the ED requiring intubation and found to have R pneumo,  History of present illness   [name] is a 86 y.o. F with PMH Atrial fibrillation, aortic valve replacement, HL, HTN and thyroid disease who has been home alone recently while her son was hospitalized with Covid-19 and then developed respiratory distress several days after his return home and was found to be Covid positive.   Pt's son who lives with her reports that at baseline she is minimally able to perform ADL's, after his return she was too weak to sit up and had not been able to take her medications.  Pt's po intake is mostly Ensure at baseline and probably was not eating or drinking while alone.   In the ED, she was hypoxic in the 80's with increasing work of breathing, so was emergently intubated. CXR revealed small basilar R pneumothorax and labs significant for lactic acid 3.9, sodium of 172,  Creatinine 1.7 with K 3.3.  PCCM consulted for admission  Past Medical History    has a past medical history of A-fib [facility]), Aortic valve prosthesis present, Chronic constipation, History of echocardiogram (2008), echocardiogram (2015), Hyperlipidemia, Hypertension, and Thyroid disease.  Significant Hospital Events   1/11 admit to PCCM  Consults:    Procedures:  1/11 ETT 1/11 R chest tube 1/11 R femoral CVC placed by EDP  Significant Diagnostic Tests:  1/11 CXR>>Small basilar right pneumothorax.  Micro  Data:  1/11 BCx2>> 1/11 UCx2>> 1/11 Respiratory culture>>   Antimicrobials:  Ceftriaxone 1/11>>> Azithromycin 1/11>>>  Interim history/subjective:  Hypotensive overnight requiring albumin and pressors  Objective   Blood pressure 123/61, pulse 75, temperature 99.1 F (37.3 C), resp. rate 19, height 4\' 10"  (1.473 m), weight 55.1 kg, SpO2 99 %.    Vent Mode: PRVC FiO2 (%):  [40 %] 40 % Set Rate:  [16 bmp] 16 bmp Vt Set:  [420 mL] 420 mL PEEP:  [5 cmH20] 5 cmH20 Plateau Pressure:  [14 cmH20-15 cmH20] 15 cmH20   Intake/Output Summary (Last 24 hours) at 01/28/2019 0844 Last data filed at 01/28/2019 0800 Gross per 24 hour  Intake 3561.8 ml  Output 1040 ml  Net 2521.8 ml   Filed Weights   01/26/19 0500 01/27/19 0351 01/28/19 0437  Weight: 53.3 kg 55.3 kg 55.1 kg   General:  Chronically ill appearing female, NAD, on vent HEENT: [place]/AT, PERRL, EOM-I and MMM, ETT in place Neuro: Grimace to pain but not responsive CV: RRR, Nl S1/S2 and -M/R/G PULM:  Diminished diffusely GI: Soft, NT, ND and +BS Extremities: -edema and -tenderness Skin: Skin is dry and flaky   I reviewed CXR myself, ETT is in a good position  Discussed with [facility] Problem list     Assessment & Plan:   Acute hypoxic respiratory failure secondary to R basilar pneumothorax and Covid-19  -Chest tube placed to suction P: - D/C Dexamethasone 10, Remdesevir 5,  cover for concurrent bacterial infection with  Ceftriaxone 7, stop dates in place - D/C levophed upon family arrival - Terminal extubation today - Titrate O2 for comfort upon discontinuation of vent  Hypernatremia and AKI -Received 3L Normal saline in the ED  -likely secondary to significant volume depletion -Creatinine 1.7, lase baseline a year ago <1.0 P: - D/C D5W - D/C free water - D/C further BMETs at this point  Hypothyroidism - TSH ok and continue synthroid  Failure to thrive  - D/C TF upon  extubation  Thrombocytopenia: - D/C CBC draws  History of Atrial Fibrillation - Sinus now, will not anti-coagulate   Plan on terminal extubation at 10 AM today.  Orders signed and held to be released by bedside RN when family is ready  Best practice:  Diet: npo Pain/Anxiety/Delirium protocol (if indicated): fentanyl gtt VAP protocol (if indicated): yes DVT prophylaxis: heparin GI prophylaxis: protonix Glucose control: n/a Mobility: bed rest Code Status: Code discussed with son and changed to DNR but continue full supportive care Family Communication: Son Adelene Idler updated with plan of care Disposition: ICU  Labs   CBC: Recent Labs  Lab 01/24/19 0008 01/24/19 0008 01/24/19 0445 01/25/19 0312 01/25/19 0418 01/26/19 0500 01/27/19 0343  WBC 10.7*  --  7.8 10.3  --  6.2 6.7  NEUTROABS 8.6*  --   --   --   --   --   --   HGB 16.3*   < > 8.6* 10.7* 10.5* 10.2* 10.4*  HCT 51.9*   < > 28.3* 34.7* 31.0* 33.1* 30.6*  MCV 123.9*  --  126.9* 123.0*  --  121.2* 114.2*  PLT 187  --  94* 119*  --  45* 31*   < > = values in this interval not displayed.    Basic Metabolic Panel: Recent Labs  Lab 01/24/19 0500 01/24/19 2026 01/25/19 0312 01/25/19 0418 01/25/19 1327 01/25/19 1730 01/26/19 0500 01/27/19 0343 01/27/19 1507  NA   < > 156* 155* 159*  --   --  145 140 144  K   < > 3.9 3.6 3.6  --   --  4.4 2.9* 3.7  CL  --  129* 129*  --   --   --  120* 110 113*  CO2  --  21* 19*  --   --   --  17* 20* 20*  GLUCOSE  --  289* 251*  --   --   --  230* 223* 90  BUN  --  87* 82*  --   --   --  58* 53* 45*  CREATININE  --  1.30* 1.22*  --   --   --  0.93 1.05* 0.90  CALCIUM  --  6.7* 6.6*  --   --   --  6.7* 6.8* 7.1*  MG   < >  --  2.2  --  2.1 2.0 1.9 1.7  --   PHOS   < >  --  2.6  --  2.1* 2.1* 1.8* 3.4  --    < > = values in this interval not displayed.   GFR: Estimated Creatinine Clearance: 32.4 mL/min (by C-G formula based on SCr of 0.9 mg/dL). Recent Labs  Lab  01/24/19 0008 01/24/19 0009 01/24/19 0134 01/24/19 0445 01/25/19 0312 01/26/19 0500 01/27/19 0343  PROCALCITON  --   --  0.15  --   --   --   --   WBC   < >  --   --  7.8 10.3 6.2 6.7  LATICACIDVEN  --  3.9* 2.9*  --   --   --   --    < > = values in this interval not displayed.    Liver Function Tests: Recent Labs  Lab 01/24/19 0008 01/24/19 0905  AST 44* 37  ALT 21 17  ALKPHOS 94 73  BILITOT 3.0* 1.7*  PROT 7.3 5.4*  ALBUMIN 2.9* 2.1*   Recent Labs  Lab 01/24/19 0008  LIPASE 33   No results for input(s): AMMONIA in the last 168 hours.  ABG    Component Value Date/Time   PHART 7.364 01/25/2019 0418   PCO2ART 31.1 (L) 01/25/2019 0418   PO2ART 102.0 01/25/2019 0418   HCO3 17.7 (L) 01/25/2019 0418   TCO2 19 (L) 01/25/2019 0418   ACIDBASEDEF 7.0 (H) 01/25/2019 0418   O2SAT 98.0 01/25/2019 0418     Coagulation Profile: Recent Labs  Lab 01/24/19 0437  INR 1.3*    Cardiac Enzymes: Recent Labs  Lab 01/24/19 0437  CKTOTAL 107    HbA1C: Hgb A1c MFr Bld  Date/Time Value Ref Range Status  01/24/2019 04:45 AM 5.5 4.8 - 5.6 % Final    Comment:    (NOTE) Pre diabetes:          5.7%-6.4% Diabetes:              >6.4% Glycemic control for   <7.0% adults with diabetes     CBG: Recent Labs  Lab 01/27/19 1649 01/27/19 1938 01/27/19 2347 01/28/19 0355 01/28/19 0749  GLUCAP 134* 96 96 119* 109*   The patient is critically ill with multiple organ systems failure and requires high complexity decision making for assessment and support, frequent evaluation and titration of therapies, application of advanced monitoring technologies and extensive interpretation of multiple databases.   Critical Care Time devoted to patient care services described in this note is  34  Minutes. This time reflects time of care of this signee Dr Jennet Maduro. This critical care time does not reflect procedure time, or teaching time or supervisory time of PA/NP/Med student/Med  Resident etc but could involve care discussion time.  Rush Farmer, M.D. [facility] Pulmonary/Critical Care Medicine.

## 2019-01-28 NOTE — Progress Notes (Addendum)
Patient blood pressure responding well to albumin and levophed infusion. Will not call family for terminal visitation at this time due to good response on low dose pressors and likelihood that patient will not expire before one-way extubation in morning. Will continue to monitor closely. If any changes, will contact family.

## 2019-01-29 LAB — CULTURE, BLOOD (ROUTINE X 2): Special Requests: ADEQUATE

## 2019-01-29 MED ORDER — WHITE PETROLATUM EX OINT
TOPICAL_OINTMENT | CUTANEOUS | Status: AC
  Filled 2019-01-29: qty 28.35

## 2019-01-29 MED ORDER — CHLORHEXIDINE GLUCONATE CLOTH 2 % EX PADS
6.0000 | MEDICATED_PAD | Freq: Every day | CUTANEOUS | Status: DC
  Administered 2019-01-29 – 2019-01-30 (×2): 6 via TOPICAL

## 2019-01-29 MED ORDER — SODIUM CHLORIDE 0.9% FLUSH: 10.0000 mL | INTRAVENOUS | Status: DC | PRN

## 2019-01-29 MED ORDER — WHITE PETROLATUM EX OINT: TOPICAL_OINTMENT | CUTANEOUS | Status: DC | PRN

## 2019-01-29 NOTE — Progress Notes (Signed)
   NAME:  [name], MRN:  [id number], DOB:  04-Sep-1929, LOS: 5 ADMISSION DATE:  01/22/2019, CONSULTATION DATE:  01/29/19 REFERRING MD:  Alcario Drought, CHIEF COMPLAINT:  Dyspnea   Brief History   87 y.o. F with PMH of Atrial Fibrillation, HL, HTN and thyroid disease who was brought in for respiratory distress.  Per report, pt lives at home with her son who was recently hospitalized for a week for Covid-19 leaving no one to care for her.  He has been home a few days, but pt developed respiratory distress and was Covid positive in the ED requiring intubation and found to have R pneumo, In the ED, she was hypoxic in the 80's with increasing work of breathing, so was emergently intubated. CXR revealed small basilar R pneumothorax and labs significant for lactic acid 3.9, sodium of 172,  Creatinine 1.7 with K 3.3.  PCCM consulted for admission  Past Medical History    has a past medical history of A-fib [facility]), Aortic valve prosthesis present, Chronic constipation, History of echocardiogram (2008), echocardiogram (2015), Hyperlipidemia, Hypertension, and Thyroid disease.  Significant Hospital Events   1/11 admit to PCCM  Consults:    Procedures:  1/11 ETT 1/11 R chest tube 1/11 R femoral CVC placed by EDP  Significant Diagnostic Tests:  1/11 CXR>>Small basilar right pneumothorax.  Micro Data:  1/11 BCx2>> coag neg staph 1/2 1/11 UCx2>>ng 1/11 Respiratory culture>>   Antimicrobials:  Ceftriaxone 1/11>>> Azithromycin 1/11>>>  Interim history/subjective:   Extubated to comfort Appears comfortable on morphine drip  Objective   Blood pressure (!) 73/46, pulse 83, temperature (!) 95.8 F (35.4 C), resp. rate 15, height 4\' 10"  (1.473 m), weight 55.1 kg, SpO2 (!) 85 %.    Vent Mode: PRVC FiO2 (%):  [40 %] 40 % Set Rate:  [16 bmp] 16 bmp Vt Set:  [420 mL] 420 mL PEEP:  [5 cmH20] 5 cmH20 Plateau Pressure:  [15 cmH20] 15 cmH20   Intake/Output Summary (Last 24 hours) at 01/29/2019 0916  Last data filed at 01/29/2019 0700 Gross per 24 hour  Intake 920.91 ml  Output 370 ml  Net 550.91 ml   Filed Weights   01/26/19 0500 01/27/19 0351 01/28/19 0437  Weight: 53.3 kg 55.3 kg 55.1 kg   Chronically ill-appearing elderly woman Appears comfortable on morphine drip No respiratory distress no accessory muscle use No abdominal breathing, soft and nontender abdomen  Resolved Hospital Problem list     Assessment & Plan:   Acute hypoxic respiratory failure secondary to R basilar pneumothorax and Covid-19  -Chest tube placed to suction P: - completed Dexamethasone &  Remdesevir  - s/p Terminal extubation 1/15 -O2 for comfort   Hypernatremia and AKI --Creatinine 1.7, lase baseline a year ago <1.0 P:    History of Atrial Fibrillation  Full comfort care, transfer to floor  Kara Mead MD. FCCP. [place] [facility] & Critical care [phone] If no response to pager , please call [phone]   01/29/2019

## 2019-01-29 NOTE — Progress Notes (Signed)
Patient continues to be unresponsive; remaining on Morphine gtt for comfort.  BLE mottled from just proximal to the knee to her toes.  Has periods of apnea.  Comfort measures in place.

## 2019-01-30 LAB — GLUCOSE, CAPILLARY: Glucose-Capillary: 113 mg/dL — ABNORMAL HIGH (ref 70–99)

## 2019-01-30 NOTE — Progress Notes (Signed)
   NAME:  [name], MRN:  [id number], DOB:  [birth year], LOS: 6 ADMISSION DATE:  01/14/2019, CONSULTATION DATE:  01/30/19 REFERRING MD:  Alcario Drought, CHIEF COMPLAINT:  Dyspnea   Brief History   86 y.o. F with PMH of Atrial Fibrillation, HL, HTN and thyroid disease who was brought in for respiratory distress.  Per report, pt lives at home with her son who was recently hospitalized for a week for Covid-19 leaving no one to care for her.  He has been home a few days, but pt developed respiratory distress and was Covid positive in the ED requiring intubation and found to have R pneumo, In the ED, she was hypoxic in the 80's with increasing work of breathing, so was emergently intubated. CXR revealed small basilar R pneumothorax and labs significant for lactic acid 3.9, sodium of 172,  Creatinine 1.7 with K 3.3.  PCCM consulted for admission  Past Medical History    has a past medical history of A-fib [facility]), Aortic valve prosthesis present, Chronic constipation, History of echocardiogram (2008), echocardiogram (2015), Hyperlipidemia, Hypertension, and Thyroid disease.  Significant Hospital Events   1/11 admit to PCCM  Consults:    Procedures:  1/11 ETT 1/11 R chest tube 1/11 R femoral CVC placed by EDP  Significant Diagnostic Tests:  1/11 CXR>>Small basilar right pneumothorax.  Micro Data:  1/11 BCx2>> coag neg staph 1/2 1/11 UCx2>>ng 1/11 Respiratory culture>>   Antimicrobials:  Ceftriaxone 1/11>>> Azithromycin 1/11>>>  Interim history/subjective:   Extubated to comfort Appears comfortable on morphine drip  Objective   Blood pressure (!) 129/53, pulse 88, temperature (!) 84 F (34.3 C), resp. rate (!) 24, height 4\' 10"  (1.473 m), weight 55.1 kg, SpO2 (!) 84 %.        Intake/Output Summary (Last 24 hours) at 01/30/2019 1158 Last data filed at 01/30/2019 1000 Gross per 24 hour  Intake 512.75 ml  Output 425 ml  Net 87.75 ml   Filed Weights   01/26/19 0500 01/27/19 0351  01/28/19 0437  Weight: 53.3 kg 55.3 kg 55.1 kg   Chronically ill-appearing elderly woman Appears comfortable on morphine drip No respiratory distress no accessory muscle use No abdominal breathing, soft and nontender abdomen  Resolved Hospital Problem list     Assessment & Plan:   Acute hypoxic respiratory failure secondary to R basilar pneumothorax and Covid-19  -Chest tube placed to suction P: - completed Dexamethasone &  Remdesevir  - s/p Terminal extubation 1/15 On morphine infusion for comfort.   Hypernatremia and AKI --Creatinine 1.7, lase baseline a year ago <1.0 P:    History of Atrial Fibrillation  Full comfort care, transfer to floor Collier Bullock, MD   01/30/2019

## 2019-02-03 ENCOUNTER — Telehealth: Payer: Self-pay

## 2019-02-03 NOTE — Telephone Encounter (Signed)
Received a dc from [place] and Barbarann Ehlers [employer]).  The dc is for burial and a patient of Doctor Olalere. I am forwarding this dc to Pulmonary Unit for processing.

## 2019-02-08 MED FILL — Fentanyl Citrate Preservative Free (PF) Inj 2500 MCG/50ML: INTRAMUSCULAR | Qty: 50 | Status: AC

## 2019-02-08 MED FILL — Sodium Chloride IV Soln 0.9%: INTRAVENOUS | Qty: 250 | Status: AC

## 2019-02-14 NOTE — Progress Notes (Signed)
Patients time of death was 1421 on 2019/02/19. Heart and lungs sounds auscultated for one full minute by Meda Klinefelter, RN and Daron Offer, RN with none heard.  This RN notified patients Son, Adelene Idler, of passing at 1500. Patients son and daughter did not wish to come to hospital to see patient. All post mortem information gotten from Son. Son thankful for our care.  CDS called 01/28/2019 at [facility]. Referral number: [id number]. Pt not candidate for donation. Time of death called by this RN at 1510 by this RN. Spoke with CDS representative [employer].

## 2019-02-14 NOTE — Death Summary Note (Signed)
DEATH SUMMARY   Patient Details  Name: [name] MRN: [id number] DOB: 07-28-1929  Admission/Discharge Information   Admit Date:  02-11-19  Date of Death: Date of Death: 2019-02-19  Time of Death: Time of Death: April 26, 1419  Length of Stay: 7  Referring Physician: [name], MD   Reason(s) for Hospitalization  86 year old lady admitted with complaints of severe weakness, decreased p.o. intake. ED evaluation revealed hypoxemia, emergently intubated, found to have a right basal pneumothorax Diagnoses  Preliminary cause of death: Hypoxemic respiratory failure secondary to COVID-19 Pneumonia Secondary Diagnoses (including complications and co-morbidities):  Principal Problem:   Acute hypoxemic respiratory failure due to COVID-19 [facility]) Active Problems:   Essential hypertension, benign   Hypothyroid   Acute encephalopathy   AKI (acute kidney injury) ([place])   Hypothermia   Hypernatremia   Acute hypoxemic respiratory failure [facility])   Brief Hospital Course (including significant findings, care, treatment, and services provided and events leading to death)  [name] is a 86 y.o. year old female who admitted with progressive weakness, decreased p.o. intake Hypoxemic at presentation. Emergently intubated on 01/24/2019 Found to have a right basal pneumothorax-chest tube was placed Was been treated for community-acquired pneumonia, Covid pneumonia. Requiring pressors. Multiple electrolyte derangements being corrected With ongoing aggressive care, goals of care discussed with family and it was elected to make patient comfort measures. Extubated on 01/28/2019 Patient succumbed to her illness on 2019-02-19  Pertinent Labs and Studies  Significant Diagnostic Studies DG Abd 1 View  Result Date: 01/25/2019 CLINICAL DATA:  Tube placement EXAM: ABDOMEN - 1 VIEW COMPARISON:  None. FINDINGS: Enteric tube is coiled within the stomach. Bowel gas pattern is unremarkable. There is large volume  stool within the distal colon. Right upper quadrant drainage catheter present. IMPRESSION: Enteric tube within the stomach. Electronically Signed   By: Macy Mis M.D.   On: 01/25/2019 10:25   CT HEAD WO CONTRAST  Result Date: 01/24/2019 CLINICAL DATA:  Encephalopathy and COVID-19 EXAM: CT HEAD WITHOUT CONTRAST TECHNIQUE: Contiguous axial images were obtained from the base of the skull through the vertex without intravenous contrast. COMPARISON:  Brain MRI 04/14/2007 FINDINGS: Brain: No evidence of acute infarction, hemorrhage, hydrocephalus, extra-axial collection or mass lesion/mass effect. Cerebral volume loss that is generalized and progressed from prior. Minor chronic small vessel ischemic type change. Vascular: No hyperdense vessel or unexpected calcification. Skull: Normal. Negative for fracture or focal lesion. Sinuses/Orbits: Chronic right maxillary sinusitis with opacification with sinus atelectasis that has progressed from prior. Partial left mastoid opacification with negative nasopharynx. IMPRESSION: 1. No acute finding. 2. Progressive generalized cerebral volume loss since 04-26-2007. 3. Chronic right maxillary sinusitis. Electronically Signed   By: Monte Fantasia M.D.   On: 01/24/2019 10:10   DG Chest Port 1 View  Result Date: 01/28/2019 CLINICAL DATA:  Endotracheal tube placement.  COVID-19 positive. EXAM: PORTABLE CHEST 1 VIEW COMPARISON:  01/27/2019 FINDINGS: Endotracheal tube has tip 4.4 cm above the carina. Nasogastric tube is looped once in the stomach with tip over the gastric fundus. Right-sided basilar chest tube unchanged. Patient is rotated to the right. Lungs are adequately inflated demonstrate hazy mildly patchy perihilar opacification without significant change likely pneumonia. No effusion. No pneumothorax. Cardiomediastinal silhouette and remainder of the exam is unchanged. IMPRESSION: Stable hazy patchy bilateral perihilar opacification likely infection. Tubes and lines as  described. Electronically Signed   By: Marin Olp M.D.   On: 01/28/2019 07:10   DG Chest Port 1 View  Result Date: 01/27/2019  CLINICAL DATA:  COVID-19 positive. EXAM: PORTABLE CHEST 1 VIEW COMPARISON:  January 26, 2019. FINDINGS: The heart size and mediastinal contours are within normal limits. Endotracheal and nasogastric tubes are unchanged in position. No pneumothorax or pleural effusion is noted. Right-sided pigtail drainage catheter is unchanged in position in right lung base. Lungs are clear. The visualized skeletal structures are unremarkable. IMPRESSION: Stable support apparatus. No acute cardiopulmonary abnormality seen. Electronically Signed   By: Marijo Conception M.D.   On: 01/27/2019 07:43   DG Chest Port 1 View  Result Date: 01/26/2019 CLINICAL DATA:  COVID-19 positive. EXAM: PORTABLE CHEST 1 VIEW COMPARISON:  January 25, 2019. FINDINGS: The heart size and mediastinal contours are within normal limits. Endotracheal and nasogastric tubes are unchanged in position. No pneumothorax or pleural effusion is noted. Right-sided pigtail drainage catheter is unchanged in position. No significant consolidative process is noted. Atherosclerosis of thoracic aorta is noted. The visualized skeletal structures are unremarkable. IMPRESSION: No active disease. Electronically Signed   By: Marijo Conception M.D.   On: 01/26/2019 07:42   DG Chest Port 1 View  Result Date: 01/25/2019 CLINICAL DATA:  COVID positive EXAM: PORTABLE CHEST 1 VIEW COMPARISON:  January 24, 2019 FINDINGS: The heart size and mediastinal contours are unchanged. ET tube is seen 2 cm above the carina. Right-sided pigtail catheter seen. Overlying median sternotomy wires are seen. The lungs are clear. No focal airspace consolidation or pleural effusion. IMPRESSION: No active disease. Electronically Signed   By: Prudencio Pair M.D.   On: 01/25/2019 06:36   DG CHEST PORT 1 VIEW  Result Date: 01/24/2019 CLINICAL DATA:  Chest tube placement  EXAM: PORTABLE CHEST 1 VIEW COMPARISON:  January 24, 2019 at 3:11 a.m. FINDINGS: The heart size and mediastinal contours are unchanged. Overlying median sternotomy wires. ETT is seen 2 cm above the carina. Interval placement of right-sided pigtail catheter. No residual right-sided pneumothorax is noted. The left lung is clear. No acute osseous abnormality. IMPRESSION: Interval placement of right pigtail catheter. No definite residual pneumothorax. Electronically Signed   By: Prudencio Pair M.D.   On: 01/24/2019 04:43   DG Chest Port 1 View  Result Date: 01/24/2019 CLINICAL DATA:  Possible free air seen on post intubation film EXAM: PORTABLE CHEST 1 VIEW COMPARISON:  January 24, 2019 FINDINGS: Single lateral decubitus film shows a small to moderate right pneumothorax. No free air seen under the hemidiaphragm. Surgical clips in right upper quadrant. IMPRESSION: Small basilar right pneumothorax. These results were called by telephone at the time of interpretation on 01/24/2019 at 3:38 am to provider DAVID [facility] , who verbally acknowledged these results. Electronically Signed   By: Prudencio Pair M.D.   On: 01/24/2019 03:38   DG Chest Portable 1 View  Result Date: 01/24/2019 CLINICAL DATA:  86 year old female intubated, enteric tube placement. Found down at home. Positive COVID-19. EXAM: PORTABLE CHEST 1 VIEW COMPARISON:  0021 hours today. FINDINGS: Portable AP supine view at 0229 hours. New abnormal lucency in the right upper quadrant projecting over the dome of the right hemidiaphragm. No pleural edge is visible. Other visible bowel gas pattern appears normal. Intubated with endotracheal tube tip in good position at the level the clavicles. No enteric tube identified. Mediastinal contours remain normal. Stable ventilation with only mild nonspecific streaky bilateral perihilar opacity. No acute osseous abnormality identified. IMPRESSION: 1. New large area of abnormal lucency projecting over the right upper quadrant  may be pneumothorax, or perhaps pneumoperitoneum. A portable left-side-down lateral decubitus  x-ray may be the quickest way to determine. 2. Endotracheal tube tip in good position. No enteric tube identified. 3. Lungs appear clear aside from mild nonspecific perihilar opacity. Study discussed by telephone with Dr. Delora Fuel on [id number] at 02:52 . Electronically Signed   By: Genevie Ann M.D.   On: 01/24/2019 02:57   DG Chest Port 1 View  Result Date: 01/24/2019 CLINICAL DATA:  Altered mental status EXAM: PORTABLE CHEST 1 VIEW COMPARISON:  April 24, 2013 FINDINGS: The heart size and mediastinal contours are unchanged. Aortic knob calcifications. Overlying median sternotomy wires. There is subpleural thickening seen within the periphery of the right mid lung. No large airspace consolidation or pleural effusion. No acute osseous abnormality. IMPRESSION: No active disease. Electronically Signed   By: Prudencio Pair M.D.   On: 01/24/2019 00:38    Microbiology Recent Results (from the past 240 hour(s))  Urine culture     Status: None   Collection Time: 01/24/19 12:25 AM   Specimen: Urine, Random  Result Value Ref Range Status   Specimen Description URINE, RANDOM  Final   Special Requests NONE  Final   Culture   Final    NO GROWTH Performed at [facility], [address], [place], [place] [address]    Report Status 01/24/2019 FINAL  Final  Culture, blood (Routine X 2) w Reflex to ID Panel     Status: Abnormal   Collection Time: 01/24/19  4:47 AM   Specimen: BLOOD  Result Value Ref Range Status   Specimen Description BLOOD RIGHT ANTECUBITAL  Final   Special Requests   Final    BOTTLES DRAWN AEROBIC AND ANAEROBIC Blood Culture adequate volume   Culture  Setup Time   Final    GRAM POSITIVE RODS AEROBIC BOTTLE ONLY CRITICAL RESULT CALLED TO, READ BACK BY AND VERIFIED WITH: V.BRYK,PHARMD AT 0458 ON 01/26/19 BY G.MCADOO    Culture (A)  Final    DIPHTHEROIDS(CORYNEBACTERIUM  SPECIES) Standardized susceptibility testing for this organism is not available. Performed at [facility], [place] [address], [place], [place] [address]    Report Status 01/29/2019 FINAL  Final  Culture, blood (Routine X 2) w Reflex to ID Panel     Status: Abnormal   Collection Time: 01/24/19  4:52 AM   Specimen: BLOOD  Result Value Ref Range Status   Specimen Description BLOOD LEFT ANTECUBITAL  Final   Special Requests   Final    BOTTLES DRAWN AEROBIC AND ANAEROBIC Blood Culture adequate volume   Culture  Setup Time   Final    GRAM POSITIVE COCCI IN CLUSTERS IN BOTH AEROBIC AND ANAEROBIC BOTTLES CRITICAL RESULT CALLED TO, READ BACK BY AND VERIFIED WITH: C.ROBERTSON,PHARMD AT [id number] ON 01/25/19 BY G.MCADOO    Culture (A)  Final    STAPHYLOCOCCUS SPECIES (COAGULASE NEGATIVE) THE SIGNIFICANCE OF ISOLATING THIS ORGANISM FROM A SINGLE SET OF BLOOD CULTURES WHEN MULTIPLE SETS ARE DRAWN IS UNCERTAIN. PLEASE NOTIFY THE MICROBIOLOGY DEPARTMENT WITHIN ONE WEEK IF SPECIATION AND SENSITIVITIES ARE REQUIRED. Performed at [facility], [place] [address], [place], [place] [address]    Report Status 01/27/2019 FINAL  Final  MRSA PCR Screening     Status: None   Collection Time: 01/25/19 11:39 AM   Specimen: Nasal Mucosa; Nasopharyngeal  Result Value Ref Range Status   MRSA by PCR NEGATIVE NEGATIVE Final    Comment:        The GeneXpert MRSA Assay (FDA approved for NASAL specimens only),  is one component of a comprehensive MRSA colonization surveillance program. It is not intended to diagnose MRSA infection nor to guide or monitor treatment for MRSA infections. Performed at [facility], [place] [address] [place] [address]     Lab Basic Metabolic Panel: Recent Labs  Lab 01/25/19 1327 01/25/19 1730 01/26/19 0500 01/27/19 0343 01/27/19 1507  NA  --   --  145 140 144  K  --   --  4.4 2.9* 3.7  CL  --   --  120* 110 113*  CO2  --   --  17* 20* 20*  GLUCOSE  --   --   230* 223* 90  BUN  --   --  58* 53* 45*  CREATININE  --   --  0.93 1.05* 0.90  CALCIUM  --   --  6.7* 6.8* 7.1*  MG 2.1 2.0 1.9 1.7  --   PHOS 2.1* 2.1* 1.8* 3.4  --    Liver Function Tests: No results for input(s): AST, ALT, ALKPHOS, BILITOT, PROT, ALBUMIN in the last 168 hours. No results for input(s): LIPASE, AMYLASE in the last 168 hours. No results for input(s): AMMONIA in the last 168 hours. CBC: Recent Labs  Lab 01/26/19 0500 01/27/19 0343  WBC 6.2 6.7  HGB 10.2* 10.4*  HCT 33.1* 30.6*  MCV 121.2* 114.2*  PLT 45* 31*   Cardiac Enzymes: No results for input(s): CKTOTAL, CKMB, CKMBINDEX, TROPONINI in the last 168 hours. Sepsis Labs: Recent Labs  Lab 01/26/19 0500 01/27/19 0343  WBC 6.2 6.7    Procedures/Operations  Endotracheal intubation 1/11 Chest tube placement 1/11  Adewale A Olalere 02/01/2019, 8:37 AM

## 2019-02-14 NOTE — Plan of Care (Signed)
  Problem: Education: Goal: Knowledge of General Education information will improve Description: Including pain rating scale, medication(s)/side effects and non-pharmacologic comfort measures Outcome: Not Met (add Reason) Note: Pt comfort care    Problem: Health Behavior/Discharge Planning: Goal: Ability to manage health-related needs will improve Outcome: Not Met (add Reason) Note: Pt is comfort care   Problem: Clinical Measurements: Goal: Ability to maintain clinical measurements within normal limits will improve Outcome: Not Met (add Reason) Note: Pt is comfort care Goal: Will remain free from infection Outcome: Not Met (add Reason) Note: Pt is comfort care Goal: Diagnostic test results will improve Outcome: Not Met (add Reason) Note: Pt is comfort care Goal: Respiratory complications will improve Outcome: Not Met (add Reason) Note: Pt is comfort care Goal: Cardiovascular complication will be avoided Outcome: Not Met (add Reason) Note: Pt is comfort care   Problem: Activity: Goal: Risk for activity intolerance will decrease Outcome: Not Met (add Reason) Note: Pt is comfort care   Problem: Nutrition: Goal: Adequate nutrition will be maintained Outcome: Not Met (add Reason) Note: Pt is comfort care   Problem: Coping: Goal: Level of anxiety will decrease Outcome: Not Met (add Reason) Note: Pt is comfort care   Problem: Elimination: Goal: Will not experience complications related to bowel motility Outcome: Not Met (add Reason) Note: Pt is comfort care Goal: Will not experience complications related to urinary retention Outcome: Not Met (add Reason) Note: Pt is comfort care   Problem: Safety: Goal: Ability to remain free from injury will improve Outcome: Not Met (add Reason) Note: Pt is comfort care   Problem: Skin Integrity: Goal: Risk for impaired skin integrity will decrease Outcome: Not Met (add Reason) Note: Pt is comfort care   Problem: Coping: Goal:  Ability to identify and develop effective coping behavior will improve Outcome: Not Met (add Reason)   Problem: Pain Management: Goal: Satisfaction with pain management regimen will improve Outcome: Not Met (add Reason)   Problem: Pain Managment: Goal: General experience of comfort will improve Outcome: Completed/Met Note: Pt is comfort care and currently comfortable will current pain regimen   Problem: Education: Goal: Knowledge of the prescribed therapeutic regimen will improve Outcome: Completed/Met   Problem: Clinical Measurements: Goal: Quality of life will improve Outcome: Completed/Met   Problem: Respiratory: Goal: Verbalizations of increased ease of respirations will increase Outcome: Completed/Met   Problem: Role Relationship: Goal: Family's ability to cope with current situation will improve Outcome: Completed/Met Goal: Ability to verbalize concerns, feelings, and thoughts to partner or family member will improve Outcome: Completed/Met

## 2019-02-14 NOTE — Progress Notes (Signed)
Nutrition Brief Note  Chart reviewed. Pt now transitioned to comfort care.  No further nutrition interventions warranted at this time.  Please re-consult as needed.   [employer] MS, RDN, LDN, CNSC [phone] Pager  [phone] Weekend/On-Call Pager

## 2019-02-14 NOTE — Progress Notes (Signed)
   NAME:  [name], MRN:  [id number], DOB:  09-Jun-1929, LOS: 7 ADMISSION DATE:  01/26/2019, CONSULTATION DATE:  02-13-2019 REFERRING MD:  Alcario Drought, CHIEF COMPLAINT:  Dyspnea   Brief History   86 y.o. F with PMH of Atrial Fibrillation, HL, HTN and thyroid disease who was brought in for respiratory distress.  Per report, pt lives at home with her son who was recently hospitalized for a week for Covid-19 leaving no one to care for her.  He has been home a few days, but pt developed respiratory distress and was Covid positive in the ED requiring intubation and found to have R pneumo, In the ED, she was hypoxic in the 80's with increasing work of breathing, so was emergently intubated. CXR revealed small basilar R pneumothorax and labs significant for lactic acid 3.9, sodium of 172,  Creatinine 1.7 with K 3.3.  PCCM consulted for admission  Past Medical History    has a past medical history of A-fib [facility]), Aortic valve prosthesis present, Chronic constipation, History of echocardiogram (2008), echocardiogram (2015), Hyperlipidemia, Hypertension, and Thyroid disease.  Significant Hospital Events   1/11 admit to PCCM  Consults:    Procedures:  1/11 ETT 1/11 R chest tube 1/11 R femoral CVC placed by EDP  Significant Diagnostic Tests:  1/11 CXR>>Small basilar right pneumothorax.  Micro Data:  1/11 BCx2>> coag neg staph 1/2 1/11 UCx2>>ng 1/11 Respiratory culture>>   Antimicrobials:  Ceftriaxone 1/11>>> Azithromycin 1/11>>>  Interim history/subjective:   Patient is comfortable   Objective   Blood pressure (!) 129/53, pulse (!) 42, temperature (!) 93 F (33.9 C), resp. rate (!) 30, height 4\' 10"  (1.473 m), weight 55.1 kg, SpO2 (!) 77 %.        Intake/Output Summary (Last 24 hours) at 02/13/19 1037 Last data filed at 02-13-2019 0800 Gross per 24 hour  Intake 521.96 ml  Output 70 ml  Net 451.96 ml   Filed Weights   01/26/19 0500 01/27/19 0351 01/28/19 0437  Weight: 53.3 kg  55.3 kg 55.1 kg   Chronically ill-appearing Comfortable on morphine drip Does not appear to be using assessor muscles or respirations No evidence of uncomfortable breathing  Resolved Hospital Problem list     Assessment & Plan:  Acute hypoxemic respiratory failure secondary to right basal pneumothorax and COVID-19 pneumonia -Chest tube in place to suction -Completed dexamethasone and remdesivir -Terminal extubation 1/15 -On morphine for comfort  History of atrial fibrillation  Patient remains in ICU as her passing is imminent  Sherrilyn Rist, MD [place], PCCM Cell: [phone]

## 2019-02-14 DEATH — deceased

## 2021-01-01 IMAGING — DX DG CHEST 1V PORT
1 series · 1 of 1 positions shown · non-contrast
Comparison: April 24, 2013

CLINICAL DATA: Altered mental status

EXAM:
PORTABLE CHEST 1 VIEW

[chest ap]
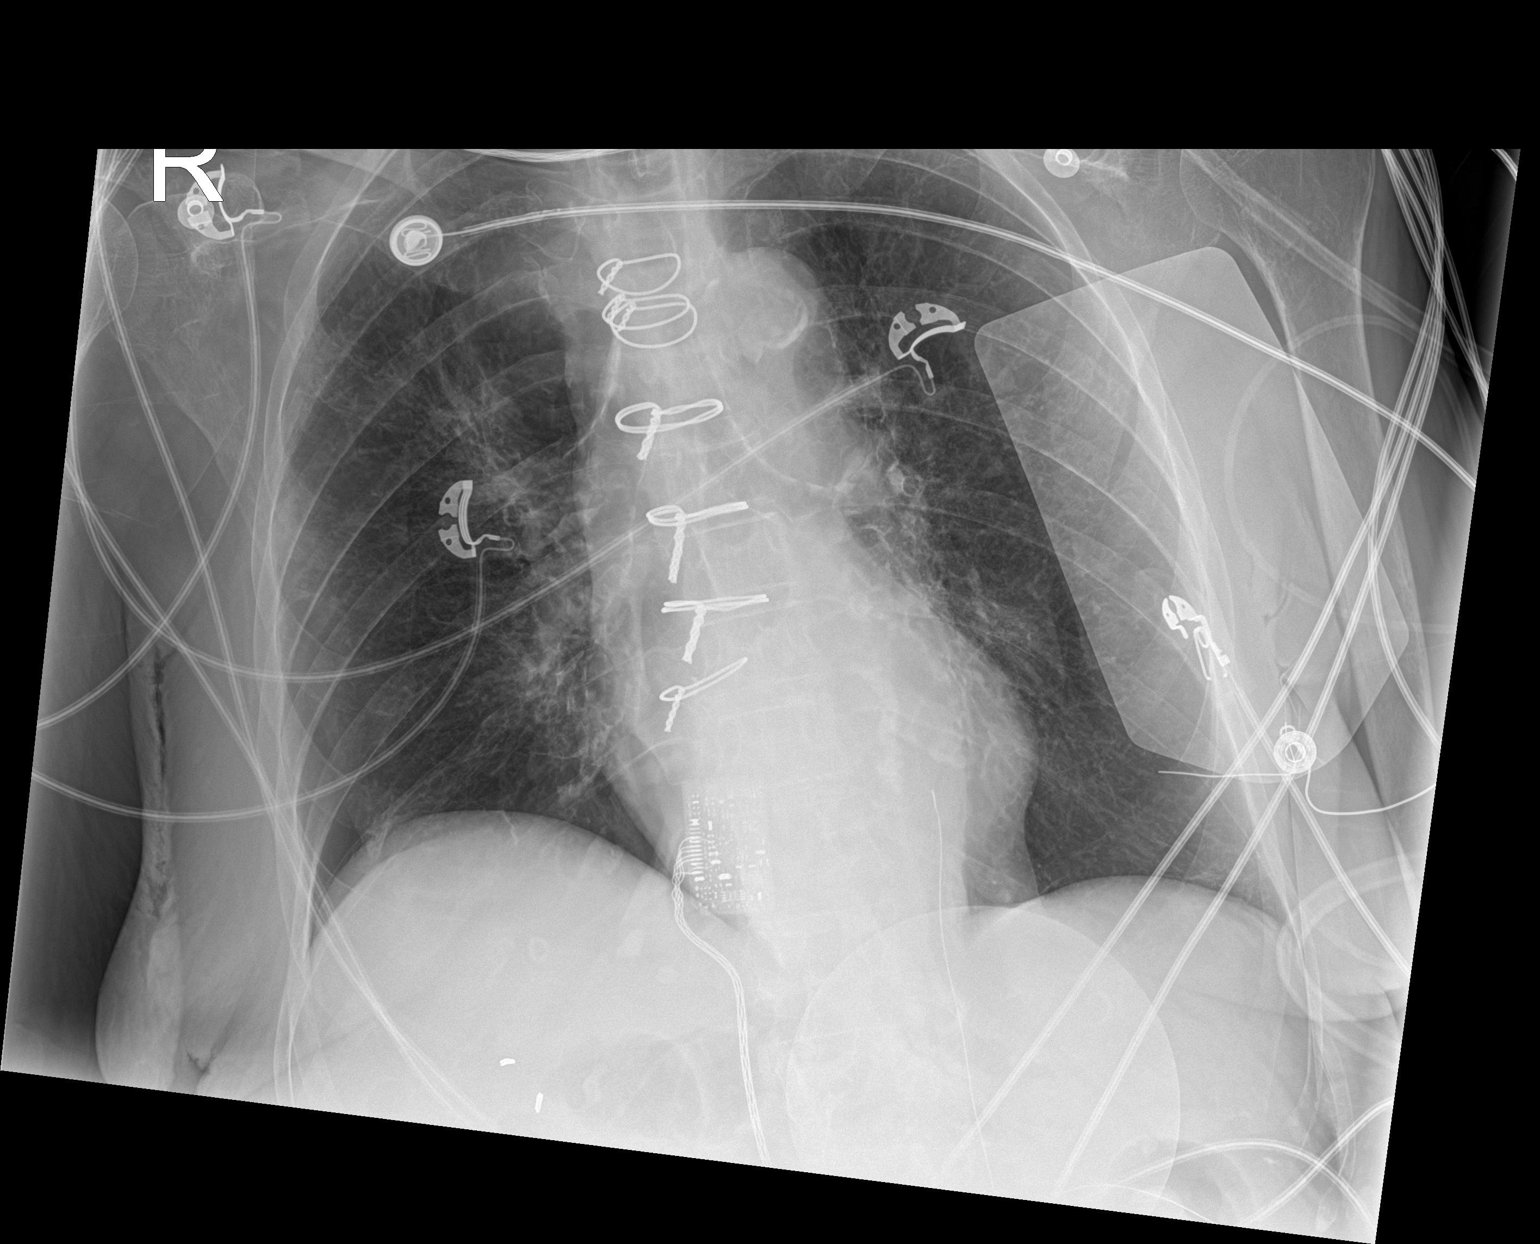

[1 of 1 positions shown; findings below may reference images not displayed]

FINDINGS: The heart size and mediastinal contours are unchanged. Aortic knob
calcifications. Overlying median sternotomy wires. There is
subpleural thickening seen within the periphery of the right mid
lung. No large airspace consolidation or pleural effusion. No acute
osseous abnormality.
IMPRESSION: No active disease.

## 2021-01-01 IMAGING — DX DG CHEST 1V PORT
1 series · 1 of 1 positions shown · non-contrast
Comparison: 4445 hours today.

CLINICAL DATA: 89-year-old female intubated, enteric tube
placement. Found down at home. Positive [id number].

EXAM:
PORTABLE CHEST 1 VIEW

[chest ap]
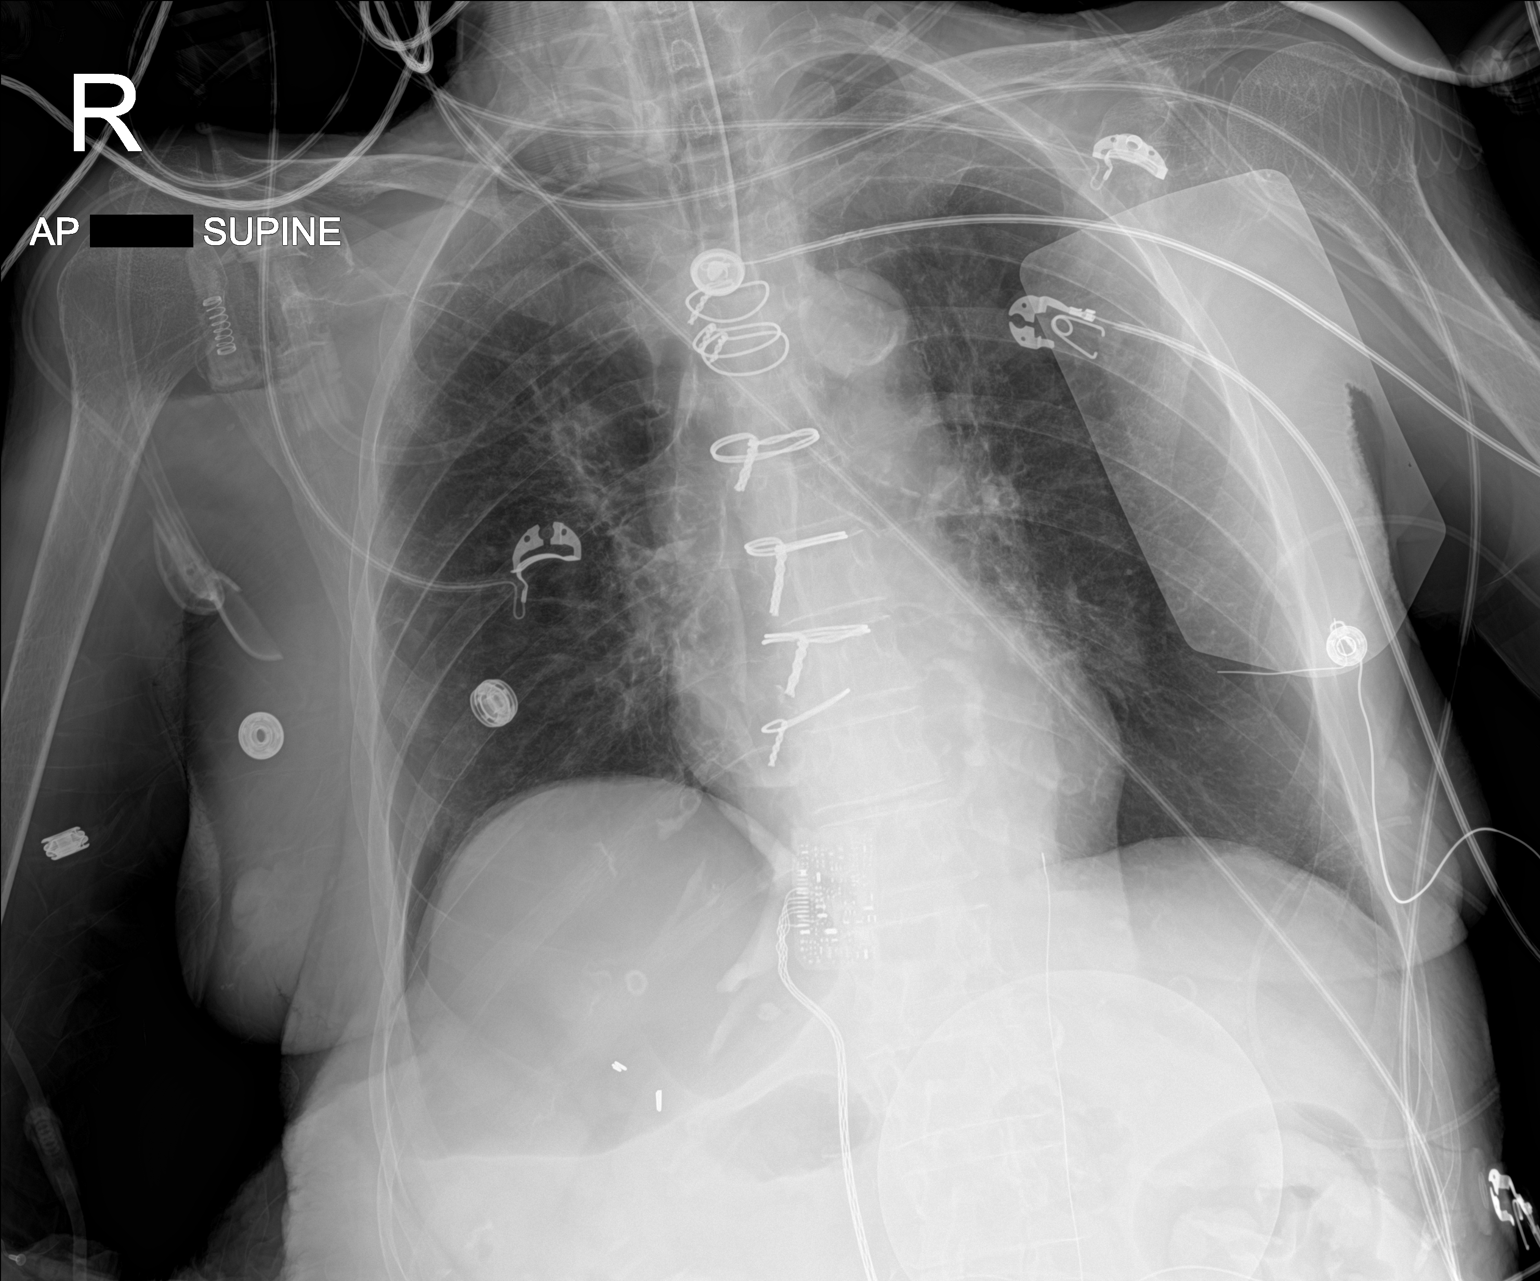

[1 of 1 positions shown; findings below may reference images not displayed]

FINDINGS: Portable AP supine view at 8991 hours. New abnormal lucency in the
right upper quadrant projecting over the dome of the right
hemidiaphragm. No pleural edge is visible. Other visible bowel gas
pattern appears normal.

Intubated with endotracheal tube tip in good position at the level
the clavicles. No enteric tube identified.

Mediastinal contours remain normal. Stable ventilation with only
mild nonspecific streaky bilateral perihilar opacity.

No acute osseous abnormality identified.
IMPRESSION: 1. New large area of abnormal lucency projecting over the right
upper quadrant may be pneumothorax, or perhaps pneumoperitoneum.
A portable left-side-down lateral decubitus x-ray may be the
quickest way to determine.
2. Endotracheal tube tip in good position. No enteric tube
identified.
3. Lungs appear clear aside from mild nonspecific perihilar opacity.

Study discussed by telephone with Dr. HAIPENG MOLONEY on 01/24/2019 at

## 2021-01-02 IMAGING — DX DG ABDOMEN 1V
1 series · 1 of 1 positions shown · non-contrast
Comparison: None.

CLINICAL DATA: Tube placement

EXAM:
ABDOMEN - 1 VIEW

[abdomen kub]
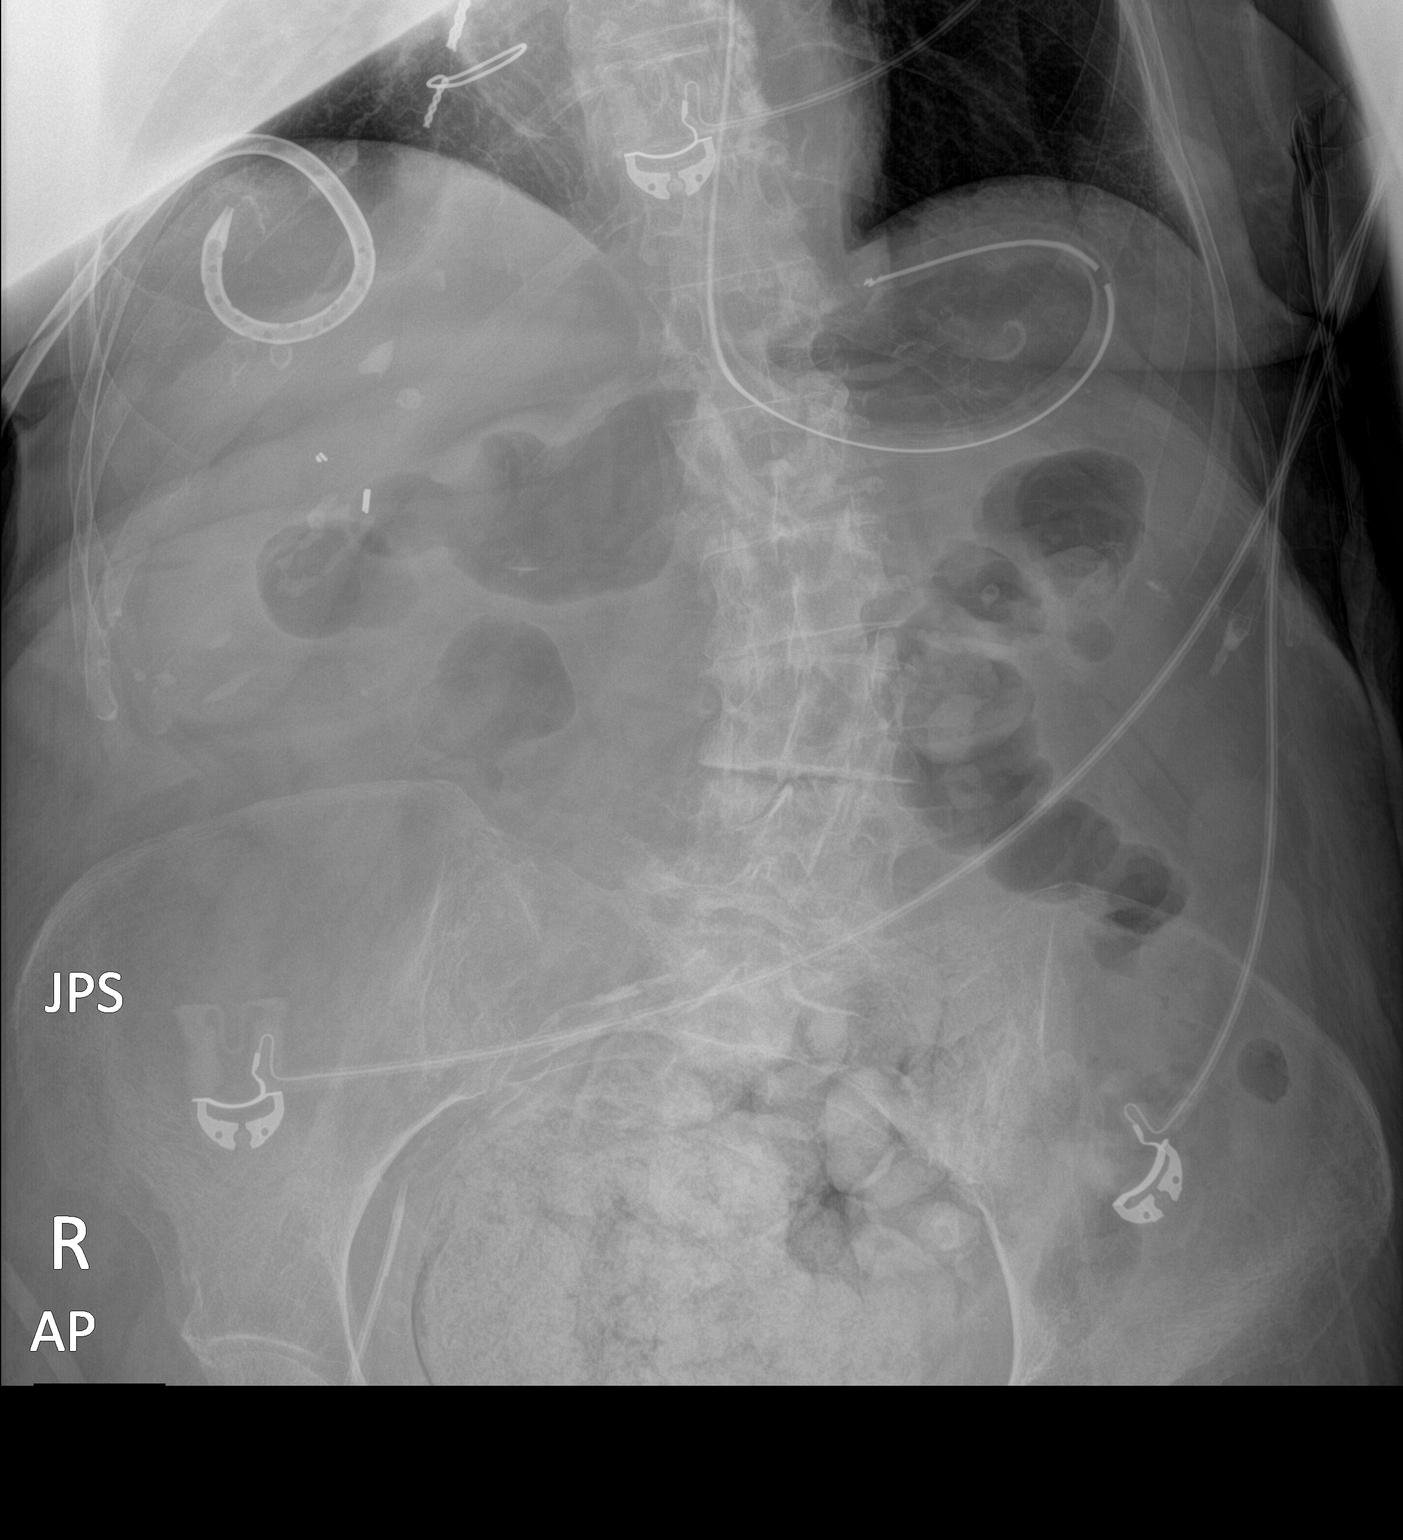

[1 of 1 positions shown; findings below may reference images not displayed]

FINDINGS: Enteric tube is coiled within the stomach. Bowel gas pattern is
unremarkable. There is large volume stool within the distal colon.
Right upper quadrant drainage catheter present.
IMPRESSION: Enteric tube within the stomach.

## 2021-01-04 IMAGING — DX DG CHEST 1V PORT
1 series · 1 of 1 positions shown · non-contrast
Comparison: January 26, 2019.

CLINICAL DATA: [id number] positive.

EXAM:
PORTABLE CHEST 1 VIEW

[chest ap]
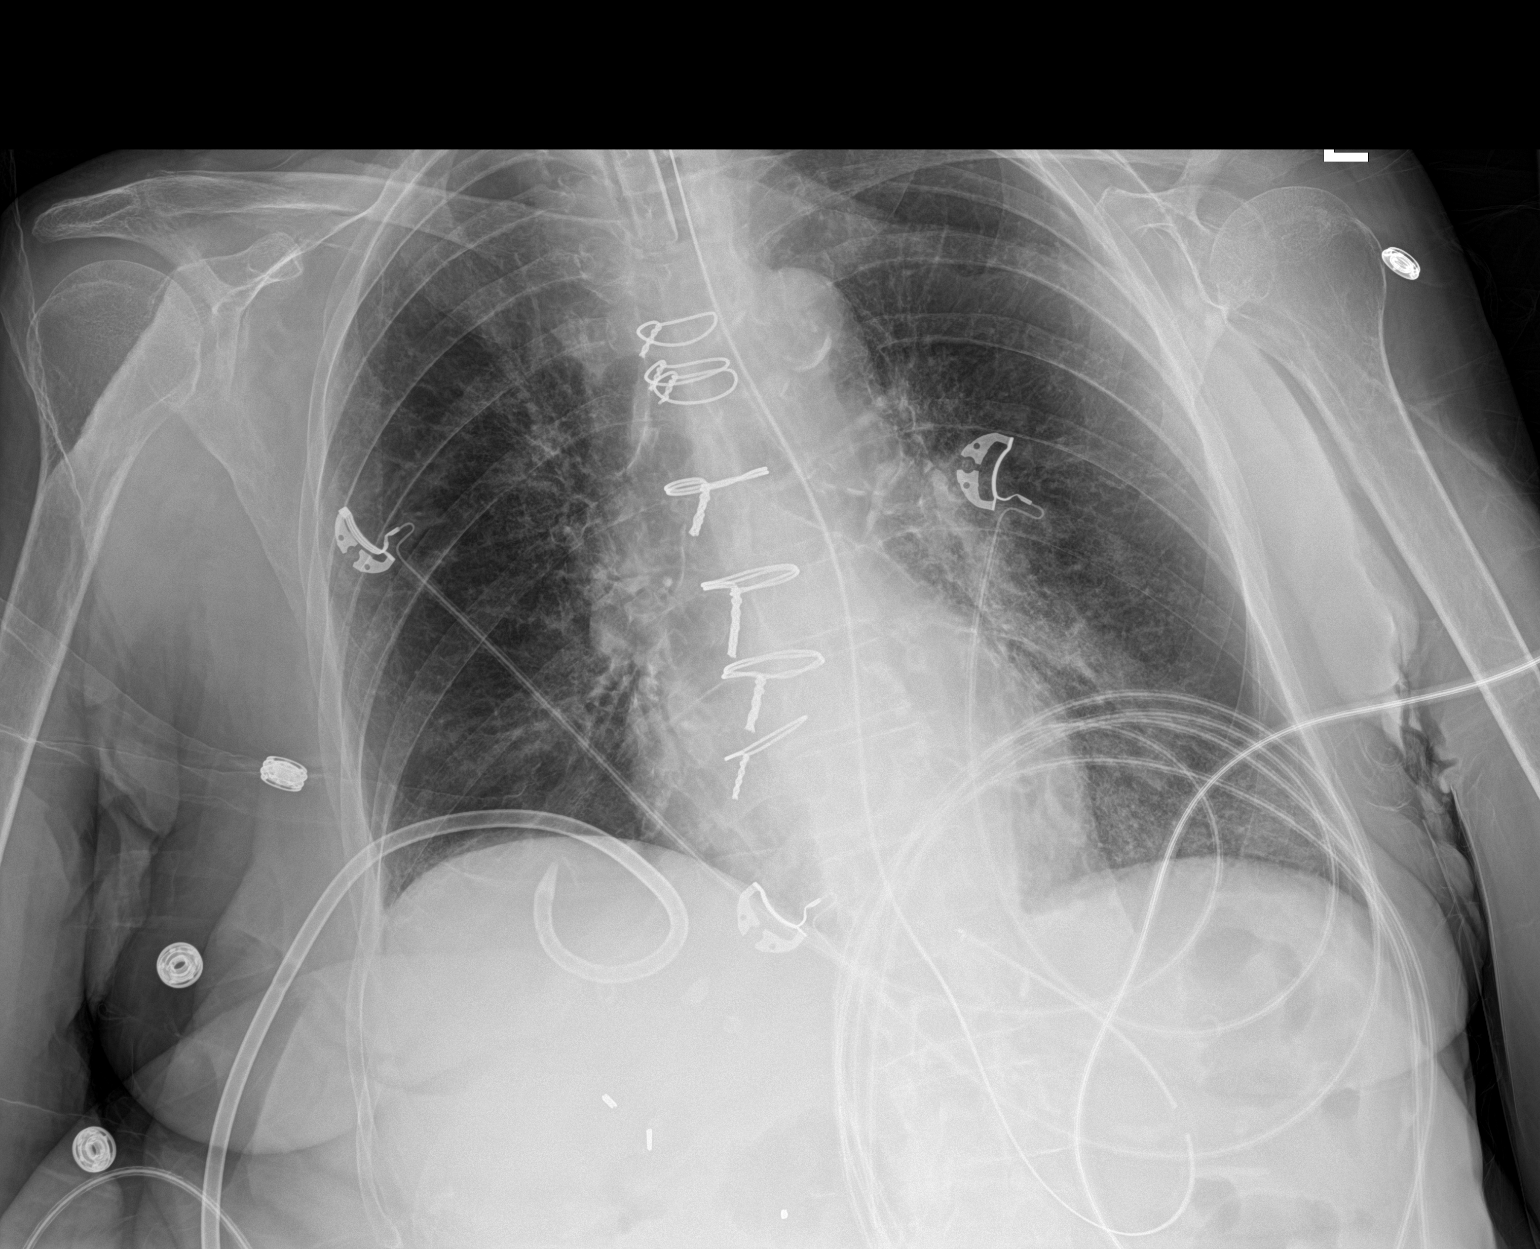

[1 of 1 positions shown; findings below may reference images not displayed]

FINDINGS: The heart size and mediastinal contours are within normal limits.
Endotracheal and nasogastric tubes are unchanged in position. No
pneumothorax or pleural effusion is noted. Right-sided pigtail
drainage catheter is unchanged in position in right lung base. Lungs
are clear. The visualized skeletal structures are unremarkable.
IMPRESSION: Stable support apparatus. No acute cardiopulmonary abnormality seen.

## 2021-01-05 IMAGING — DX DG CHEST 1V PORT
1 series · 1 of 1 positions shown · non-contrast
Comparison: 01/27/2019

CLINICAL DATA: Endotracheal tube placement.  [id number] positive.

EXAM:
PORTABLE CHEST 1 VIEW

[chest ap]
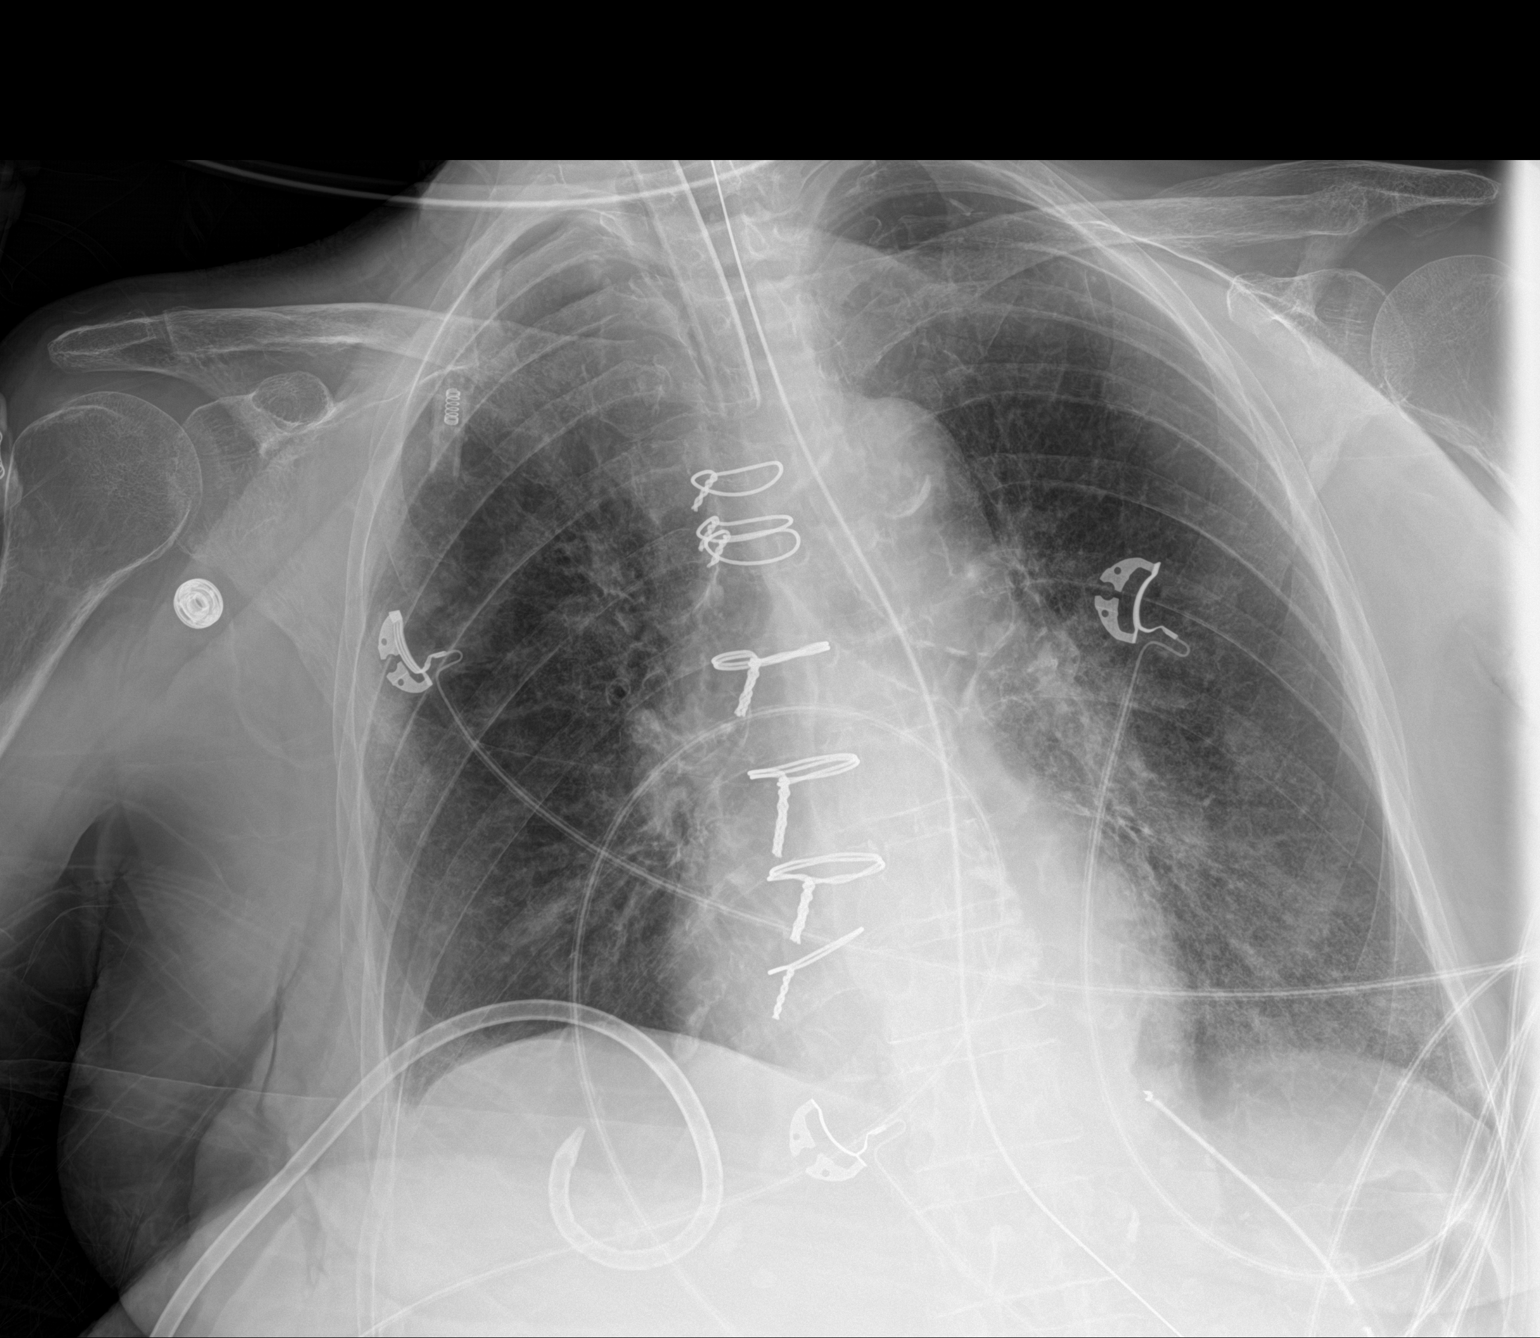

[1 of 1 positions shown; findings below may reference images not displayed]

FINDINGS: Endotracheal tube has tip 4.4 cm above the carina. Nasogastric tube
is looped once in the stomach with tip over the gastric fundus.
Right-sided basilar chest tube unchanged.

Patient is rotated to the right. Lungs are adequately inflated
demonstrate hazy mildly patchy perihilar opacification without
significant change likely pneumonia. No effusion. No pneumothorax.
Cardiomediastinal silhouette and remainder of the exam is unchanged.
IMPRESSION: Stable hazy patchy bilateral perihilar opacification likely
infection.

Tubes and lines as described.
